# Patient Record
Sex: Female | Born: 1990
Health system: Southern US, Community
[De-identification: ages and names within clinical notes are randomized; demographics above are authoritative.]

## PROBLEM LIST (undated history)

## (undated) DIAGNOSIS — B009 Herpesviral infection, unspecified: Secondary | ICD-10-CM

## (undated) DIAGNOSIS — D649 Anemia, unspecified: Secondary | ICD-10-CM

## (undated) HISTORY — PX: OTHER SURGICAL HISTORY: SHX169

## (undated) HISTORY — DX: Anemia, unspecified: D64.9

## (undated) HISTORY — DX: Herpesviral infection, unspecified: B00.9

---
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
## 2013-04-23 ENCOUNTER — Ambulatory Visit
Admit: 2013-04-23 | Discharge: 2013-04-23 | Disposition: A | Discharge status: Home / Self Care | Payer: Self-pay | Source: Ambulatory Visit | Attending: Nurse Practitioner | Admitting: Nurse Practitioner

## 2013-04-24 ENCOUNTER — Ambulatory Visit
Admit: 2013-04-24 | Discharge: 2013-04-24 | Disposition: A | Discharge status: Home / Self Care | Payer: Self-pay | Source: Ambulatory Visit | Attending: Internal Medicine | Admitting: Internal Medicine

## 2013-04-24 LAB — CBC
Hematocrit: 40 % (ref 34–45)
Hemoglobin: 12.7 g/dL (ref 11.2–15.7)
MCH: 26 pg/cell (ref 26–32)
MCHC: 32 g/dL (ref 32–36)
MCV: 81 fL (ref 79–95)
Platelets: 406 10*3/uL — ABNORMAL HIGH (ref 160–370)
RBC: 4.9 MIL/uL (ref 3.9–5.2)
RDW: 15.7 % — ABNORMAL HIGH (ref 11.7–14.4)
WBC: 4.6 10*3/uL (ref 4.0–10.0)

## 2013-04-24 LAB — FERRITIN: Ferritin: 13 ng/mL (ref 10–120)

## 2013-04-25 LAB — GRAM STAIN- VAGINOSIS: Gram Stain-Vaginosis: 0

## 2013-05-22 ENCOUNTER — Ambulatory Visit
Admit: 2013-05-22 | Discharge: 2013-05-22 | Disposition: A | Discharge status: Home / Self Care | Payer: Self-pay | Source: Ambulatory Visit | Attending: Internal Medicine | Admitting: Internal Medicine

## 2013-05-23 LAB — VITAMIN B12: Vitamin B12: 757 pg/mL (ref 211–946)

## 2013-05-23 LAB — AEROBIC CULTURE: Aerobic Culture: 0

## 2014-04-21 ENCOUNTER — Encounter: Payer: Self-pay | Admitting: Emergency Medicine

## 2014-04-21 ENCOUNTER — Emergency Department
Admission: EM | Admit: 2014-04-21 | Disposition: A | Discharge status: Home / Self Care | Payer: Self-pay | Source: Ambulatory Visit | Attending: Emergency Medicine | Admitting: Emergency Medicine

## 2014-04-21 LAB — HM HIV SCREENING OFFERED

## 2014-04-21 LAB — CBC AND DIFFERENTIAL
Baso # K/uL: 0 10*3/uL (ref 0.0–0.1)
Basophil %: 0.8 %
Eos # K/uL: 0.1 10*3/uL (ref 0.0–0.4)
Eosinophil %: 1.5 %
Hematocrit: 35 % (ref 34–45)
Hemoglobin: 10.9 g/dL — ABNORMAL LOW (ref 11.2–15.7)
IMM Granulocytes #: 0 10*3/uL (ref 0.0–0.1)
IMM Granulocytes: 0.2 %
Lymph # K/uL: 2.3 10*3/uL (ref 1.2–3.7)
Lymphocyte %: 44.8 %
MCH: 24 pg/cell — ABNORMAL LOW (ref 26–32)
MCHC: 31 g/dL — ABNORMAL LOW (ref 32–36)
MCV: 78 fL — ABNORMAL LOW (ref 79–95)
Mono # K/uL: 0.4 10*3/uL (ref 0.2–0.9)
Monocyte %: 7.5 %
Neut # K/uL: 2.4 10*3/uL (ref 1.6–6.1)
Nucl RBC # K/uL: 0 10*3/uL (ref 0.0–0.0)
Nucl RBC %: 0 /100 WBC (ref 0.0–0.2)
Platelets: 507 10*3/uL — ABNORMAL HIGH (ref 160–370)
RBC: 4.5 MIL/uL (ref 3.9–5.2)
RDW: 16.2 % — ABNORMAL HIGH (ref 11.7–14.4)
Seg Neut %: 45.2 %
WBC: 5.2 10*3/uL (ref 4.0–10.0)

## 2014-04-21 LAB — BASIC METABOLIC PANEL
Anion Gap: 16 (ref 7–16)
CO2: 22 mmol/L (ref 20–28)
Calcium: 9.7 mg/dL (ref 9.0–10.4)
Chloride: 101 mmol/L (ref 96–108)
Creatinine: 0.78 mg/dL (ref 0.51–0.95)
GFR,Black: 125 *
GFR,Caucasian: 109 *
Glucose: 89 mg/dL (ref 60–99)
Lab: 12 mg/dL (ref 6–20)
Potassium: 4.7 mmol/L (ref 3.3–5.1)
Sodium: 139 mmol/L (ref 133–145)

## 2014-04-21 LAB — URINALYSIS WITH MICROSCOPIC
Ketones, UA: NEGATIVE
Nitrite,UA: NEGATIVE
Protein,UA: NEGATIVE mg/dL
RBC,UA: 1 /hpf (ref 0–2)
Specific Gravity,UA: 1.02 (ref 1.002–1.030)
WBC,UA: 3 /hpf (ref 0–5)
pH,UA: 5 (ref 5.0–8.0)

## 2014-04-21 LAB — HOLD SST

## 2014-04-21 LAB — BHCG, QUANT PREGNANCY: BHCG, QUANT PREGNANCY: <1 m[IU]/mL (ref 0–1)

## 2014-04-21 LAB — HOLD GREEN WITH GEL

## 2014-04-21 MED ORDER — SODIUM CHLORIDE 0.9 % IV BOLUS *I*
1000.0000 mL | Freq: Once | Status: AC
Start: 2014-04-21 — End: 2014-04-21
  Administered 2014-04-21: 1000 mL via INTRAVENOUS

## 2014-04-21 NOTE — ED Notes (Signed)
C/o left lower abdominal pain and constipation for the past several days. Last BM on this past Friday.

## 2014-04-21 NOTE — ED Notes (Signed)
Pt c/o left hip pain since Saturday, sharp pain. Non-radiating. Notes previously constipated x1 week and pain started after that. Denies numbness, burning, dizziness, n/v/d at this time. PIV placed.

## 2014-04-21 NOTE — ED Notes (Signed)
Patient ambulated to bathroom without issue, chart reviewed, treatment continues per medical orders.

## 2014-04-21 NOTE — ED Provider Notes (Addendum)
History     Chief Complaint   Patient presents with    Abdominal Pain    Constipation       HPI Comments: 24 y.o. female w/ pmh of prediabetes and undergoing workup of PCOS presents to the ED with hip pain and constipation. She notes what she thinks is related constipation the last week due to dietary changes but presented due to acute hip pain since last weekend.  No trauma or strain recalled. Did start working out recently. Pain is recreated with pressure over her hip and by walking.  Occasional dark urine noted without pain or gross hematuria.  No fevers, recent illness, N/V/D, weight change.  Abnormal menstrual cycles at baseline, last LMP Nov? or Dec?, on birth control, has never been sexually active.  No night sweats.       History provided by:  Patient      Past Medical History   Diagnosis Date    Prediabetes             History reviewed. No pertinent past surgical history.    History reviewed. No pertinent family history.      Social History      has no tobacco, alcohol, drug, and sexual activity history on file.    Living Situation     Questions Responses    Patient lives with     Homeless     Caregiver for other family member     External Services     Employment     Domestic Violence Risk           Problem List     There is no problem list on file for this patient.      Review of Systems   Review of Systems   Constitutional: Negative for fever and unexpected weight change.   HENT: Negative for ear pain.    Eyes: Negative for pain.   Respiratory: Negative for shortness of breath.    Cardiovascular: Negative for chest pain.   Gastrointestinal: Positive for constipation. Negative for nausea, vomiting and abdominal pain.   Genitourinary: Negative for dysuria and pelvic pain.   Musculoskeletal: Positive for arthralgias. Negative for back pain and neck pain.   Skin: Negative for wound.   Neurological: Negative for weakness, numbness and headaches.   Psychiatric/Behavioral: Negative for confusion.              Physical Exam       ED Triage Vitals   BP Heart Rate Heart Rate (via Pulse Ox) Resp Temp Temp src SpO2 O2 Device O2 Flow Rate   04/21/14 0838 04/21/14 1610 -- 04/21/14 9604 04/21/14 5409 04/21/14 0838 04/21/14 0838 04/21/14 0838 --   119/74 mmHg 72  20 36.6 C (97.9 F) TEMPORAL 99 % None (Room air)       Weight           --                          Physical Exam   Constitutional: She is oriented to person, place, and time. No distress.   HENT:   Head: Normocephalic and atraumatic.   Right Ear: External ear normal.   Left Ear: External ear normal.   Nose: Nose normal.   Mouth/Throat: Oropharynx is clear and moist.   Eyes: Conjunctivae are normal.   Neck: No tracheal deviation present.   Cardiovascular: Normal rate, regular rhythm and intact distal pulses.  Pulmonary/Chest: Effort normal. No respiratory distress. She has no wheezes. She has no rales. She exhibits no tenderness.   Abdominal: Soft. She exhibits no distension. There is no tenderness. There is no rebound and no guarding.   Musculoskeletal:        Left hip: She exhibits tenderness and bony tenderness. She exhibits normal range of motion and no deformity.        Legs:  Mild limp with walking.    Neurological: She is alert and oriented to person, place, and time.   Skin: Skin is warm and dry. She is not diaphoretic.   Psychiatric: She has a normal mood and affect.   Nursing note and vitals reviewed.      Medical Decision Making      Amount and/or Complexity of Data Reviewed  Clinical lab tests: ordered  Tests in the radiology section of CPT: ordered  Independent visualization of images, tracings, or specimens: yes        Initial Evaluation:  ED First Provider Contact     Date/Time Event User Comments    04/21/14 [phone] ED Provider First Contact Glennon MacWEBER, MARY ANN Initial Face to Face Provider Contact          Patient seen by me today 04/21/2014 at 0924    Assessment: 24 y.o. female w/ pmh of prediabetes and undergoing workup of PCOS presents to the ED  with hip pain and constipation. Vitals reassuring. Pt's pain is in musculoskeletal distribution low concern for intraabdominal or pelvic pathology. Vitals reassuring. Labs ordered in triage are reassuring.  We will obtain a screening x-ray.      Differential: Musculoskeletal pain, inguinal ligament strain, bony lesions, Anemia, Electrolyte abnormalities, pregnancy, UTI      Plan:   Labs:   CBC w/ diff  BMP  UA  Pregnancy    Imaging:  X-ray left hip    IVF: 0.9NS bolus    Dispo pending workup.     Joaquim LaiSathyaraj Murugappan, MD  Emergency Medicine PGY3        Joaquim LaiMurugappan, Sathyaraj, MD  04/21/14 1015  Resident Attestation:     Patient seen by me today, 04/21/2014 at 1005    History:   I reviewed this patient, reviewed the resident's note and agree.  Exam:   I examined this patient, reviewed the resident's note and agree.    Decision Making:   I discussed with the resident his/her documented decision making  and agree.      Author Joanna PuffJOEL L Acy Orsak, MD    Joanna PuffMay, Gena Laski L, MD  04/21/14 [phone]

## 2014-04-21 NOTE — First Provider Contact (Signed)
ED Medical Screening Exam Note    Initial provider evaluation performed by   ED First Provider Contact     Date/Time Event User Comments    04/21/14 [phone] ED Provider First Contact Megan Henderson ANN Initial Face to Face Provider Contact        Patient with " constipation" for a week, decreased urine output, Left lower abdominal/hip pain, no history of constipation  Vital signs reviewed.  Orders placed:  LABS, ANALGESIA and urine     Patient requires further evaluation.     Megan Henderson ANN SlickWEBER, NP, 04/21/2014, 8:42 AM    Supervising physician Dr Silva BandyBodkin  was immediately available

## 2014-04-21 NOTE — ED Notes (Addendum)
Assumed care of patient. C/o of left hip pain that may be due to her constipation. Pt denies n/v/d. Pt is in gown and resting comfortably in bed. Will continue to monitor and treat per MD orders.

## 2014-04-21 NOTE — ED Notes (Signed)
ED RN INTERN ATTESTATION       I Isaiah SergeJoseph Chiann Goffredo, RN (RN) reviewed the following charting information by the RN intern:CT    Nursing Assessments  Medications  Plan of Care  Teaching   Notes    In the chart of Megan HurlJulissa Henderson (23 y.o. female) and attest to the charting being accurate.

## 2014-04-21 NOTE — Discharge Instructions (Signed)
You were seen in the ED for left hip pain. An exam and workup was reassuring. Your symptoms improved with treatment in the ED. You likely have musculoskeletal pain as a cause of your symptoms. You may take ibuprofen 400mg  every 6 hours to treat your pain. You may take acetaminophen 1000mg  every 6 hours to treat your pain. Please follow up as above, or with your PCP for further outpatient workup of your symptoms.   Please return to the ED if you have severe persistent pain, persistent fevers >100.4 or any other concerning symptoms.  Please read the attached handout for your information.

## 2014-07-10 ENCOUNTER — Emergency Department
Admission: EM | Admit: 2014-07-10 | Disposition: A | Discharge status: Home / Self Care | Payer: Self-pay | Source: Ambulatory Visit | Attending: Emergency Medicine | Admitting: Emergency Medicine

## 2014-07-10 ENCOUNTER — Encounter: Payer: Self-pay | Admitting: Emergency Medicine

## 2014-07-10 NOTE — ED Notes (Signed)
[facility] currently alert but intoxicated, +n/v.

## 2014-07-10 NOTE — ED Provider Notes (Addendum)
History     Chief Complaint   Patient presents with    Alcohol Intoxication       HPI Comments: Megan Henderson is a 24 y.o. Female, previously healthy presenting with ETOH intoxication.  [place], notes 7 shots of liquor.  Notes emesis.  Denies any current pain, ingestion, or drug use.  Denies HA, neck pain, chest pain, SOB, abdominal pain.  Denies SI or HI.  Without complaints.  Tolerating PO.  Sober friend with patient and able to observe patient all night.      History provided by:  Patient      Past Medical History   Diagnosis Date    Prediabetes             History reviewed. No pertinent past surgical history.    No family history on file.      Social History      has no tobacco, alcohol, drug, and sexual activity history on file.    Living Situation     Questions Responses    Patient lives with     Homeless     Caregiver for other family member     External Services     Employment     Domestic Violence Risk           Problem List     There is no problem list on file for this patient.      Review of Systems   Review of Systems   Constitutional: Negative for fever.   Respiratory: Negative for shortness of breath.    Cardiovascular: Negative for chest pain.   Gastrointestinal: Positive for nausea and vomiting. Negative for abdominal pain and diarrhea.   Genitourinary: Negative for dysuria.   Musculoskeletal: Negative for gait problem.   Skin: Negative for rash.   Neurological: Negative for seizures and headaches.   Psychiatric/Behavioral:        ETOH intox.       Physical Exam     ED Triage Vitals   BP Heart Rate Heart Rate (via Pulse Ox) Resp Temp Temp src SpO2 O2 Device O2 Flow Rate   07/10/14 1935 07/10/14 1935 -- 07/10/14 1935 07/10/14 1935 07/10/14 1934 07/10/14 1935 07/10/14 1934 --   104/74 mmHg 67  12 36.5 C (97.7 F) TEMPORAL 100 % None (Room air)       Weight           07/10/14 1935           68.04 kg (150 lb)               Physical Exam   Constitutional: She is oriented  to person, place, and time. No distress.   HENT:   Head: Normocephalic and atraumatic.   Eyes: EOM are normal. Pupils are equal, round, and reactive to light.   Neck: Normal range of motion.   Cardiovascular: Normal rate.    Pulmonary/Chest: Effort normal. No respiratory distress.   Abdominal: Soft. There is no tenderness. There is no rebound and no guarding.   Musculoskeletal: Normal range of motion. She exhibits no tenderness.   Neurological: She is alert and oriented to person, place, and time.   Notable for mild intoxication.   Skin: Skin is warm and dry.   Psychiatric: She has a normal mood and affect.   Nursing note and vitals reviewed.      Medical Decision Making      Amount and/or Complexity of Data Reviewed  Review and summarize past medical records: yes        Initial Evaluation:  ED First Provider Contact     Date/Time Event User Comments    07/10/14 2108 ED Provider First Contact LIN, HOWARD Initial Face to Face Provider Contact          Patient seen by me as above    Assessment: Megan Henderson is a 24 y.o. Female, previously healthy presenting with ETOH intoxication.  The physical exam is significant for intoxication.  Based on the presenting symptoms, physical exam, and review of medical records, the differential diagnosis is concerning for but not limited to the following.    Differential Diagnosis: ETOH intoxication.  No clinical signs of trauma.  Low suspicion for ingestion, or drug use.    Plan    PO Trial.    Disposition: Home.      Billie LadeHoward Lin, MD              Billie LadeLin, Howard, MD  07/10/14 2136    Resident Attestation:     Patient seen by me on arrival date of 07/10/2014 at 2124    History:   I reviewed this patient, reviewed the resident's note and agree.  Exam:   I examined this patient, reviewed the resident's note and agree.    Decision Making:   I discussed with the resident his/her documented decision making  and agree.      Brief Summary:    24 year old female presents with alcohol  intoxication.  Low suspicion for trauma thus no imaging ordered.  Patient was monitored in the emergency department and she remained able to care for her own needs.  The patient was discharged with her friend to plan to stay with her and keep a close eye on her.    Odis LusterAuthor Charisse Wendell A, MD        Candise CheMazzillo, Trinda Harlacher A, MD  07/11/14 [phone]

## 2014-07-10 NOTE — ED Notes (Signed)
Pt presents to the ED after being found [place] intoxicated and supposedly unresponsive, currently responding to questions, rudely but responding.  States that she had 7 shots but [facility].  Pt laying with friend at bedside.

## 2014-07-10 NOTE — Discharge Instructions (Signed)
You were seen for alcohol intoxication.  Your work up reveals that you do not have an emergent or life threatening process at this time, though there is always the possibility that something may develop, thus it is very important that you follow up with your primary care doctor as we discussed.    Follow up with your primary care doctor as needed and please bring them a copy of these instructions so that they may know what happened here in the emergency department.    Return to your doctor's office or to the emergency department immediately for any persistent symptoms, persistent pain, fever, persistent nausea and vomiting, overall general feeling of being sick, or any other concerning symptoms.    [facility].

## 2014-07-10 NOTE — ED Provider Progress Notes (Signed)
ED Provider Progress Note      Last Filed Vitals    07/10/14 1935   BP: 104/74   Pulse: 67   Temp: 36.5 C (97.7 F)   Resp: 12   SpO2: 100%       The patient is without complaints.    The patient is ambulating, tolerating PO, and vital signs are stable. Discharged with strict return precautions.  Pt's friend agrees to monitoring patient overnight.       Billie LadeHoward Chasya Zenz, MD, 07/10/2014, 9:36 PM

## 2019-01-07 DIAGNOSIS — H52223 Regular astigmatism, bilateral: Secondary | ICD-10-CM | POA: Diagnosis not present

## 2019-05-12 ENCOUNTER — Ambulatory Visit: Payer: Self-pay

## 2019-05-20 DIAGNOSIS — N920 Excessive and frequent menstruation with regular cycle: Secondary | ICD-10-CM | POA: Diagnosis not present

## 2019-05-20 DIAGNOSIS — M549 Dorsalgia, unspecified: Secondary | ICD-10-CM | POA: Diagnosis not present

## 2019-05-29 DIAGNOSIS — B001 Herpesviral vesicular dermatitis: Secondary | ICD-10-CM | POA: Diagnosis not present

## 2019-05-29 DIAGNOSIS — M25531 Pain in right wrist: Secondary | ICD-10-CM | POA: Diagnosis not present

## 2019-05-29 DIAGNOSIS — N644 Mastodynia: Secondary | ICD-10-CM | POA: Diagnosis not present

## 2019-05-29 DIAGNOSIS — M25532 Pain in left wrist: Secondary | ICD-10-CM | POA: Diagnosis not present

## 2019-06-02 DIAGNOSIS — Z13220 Encounter for screening for lipoid disorders: Secondary | ICD-10-CM | POA: Diagnosis not present

## 2019-06-02 DIAGNOSIS — R8290 Unspecified abnormal findings in urine: Secondary | ICD-10-CM | POA: Diagnosis not present

## 2019-06-12 DIAGNOSIS — Z20822 Contact with and (suspected) exposure to covid-19: Secondary | ICD-10-CM | POA: Diagnosis not present

## 2019-06-19 ENCOUNTER — Encounter: Payer: Self-pay | Admitting: Certified Nurse Midwife

## 2019-06-19 ENCOUNTER — Other Ambulatory Visit: Payer: Self-pay

## 2019-06-19 ENCOUNTER — Ambulatory Visit (INDEPENDENT_AMBULATORY_CARE_PROVIDER_SITE_OTHER): Payer: BC Managed Care – PPO | Admitting: Certified Nurse Midwife

## 2019-06-19 VITALS — BP 118/81 | HR 75 | Ht 66.0 in | Wt 193.2 lb

## 2019-06-19 DIAGNOSIS — Z6831 Body mass index (BMI) 31.0-31.9, adult: Secondary | ICD-10-CM

## 2019-06-19 DIAGNOSIS — N921 Excessive and frequent menstruation with irregular cycle: Secondary | ICD-10-CM

## 2019-06-19 DIAGNOSIS — N644 Mastodynia: Secondary | ICD-10-CM | POA: Diagnosis not present

## 2019-06-19 DIAGNOSIS — E669 Obesity, unspecified: Secondary | ICD-10-CM

## 2019-06-19 DIAGNOSIS — Z8349 Family history of other endocrine, nutritional and metabolic diseases: Secondary | ICD-10-CM

## 2019-06-19 NOTE — Progress Notes (Signed)
GYN ENCOUNTER NOTE  Subjective:       Stacy Harding is a 29 y.o. G0P0000 female is here for gynecologic evaluation of the following issues:  1. Menorrhagia with irregular cycle 2. Breast pain  Menarche at age 60. Currently working on shifting diet, decreasing dairy and meat intake.   Notes occasional hip and gas pain.   Family history of thyroid disease. Previous provided suspected PCOS.   Denies difficulty breathing or respiratory distress, chest pain, abdominal pain, dysuria, and leg pain or swelling.   Gynecologic History  Patient's last menstrual period was 06/02/2019 (exact date). Period Pattern: (!) Irregular Menstrual Flow: Light, Moderate, Heavy Menstrual Control: Thin pad, Maxi pad Dysmenorrhea: None  Contraception: abstinence   Last Pap: 09/2018. Results were: abnormal, normal colposcopy; recommendation for repeat in one (1) year  Obstetric History  OB History  Gravida Para Term Preterm AB Living  0 0 0 0 0 0  SAB TAB Ectopic Multiple Live Births  0 0 0 0 0    Past Medical History:  Diagnosis Date  . Herpes simplex type 1 infection    No Known Allergies  Social History   Socioeconomic History  . Marital status: Single    Spouse name: Not on file  . Number of children: Not on file  . Years of education: Not on file  . Highest education level: Not on file  Occupational History  . Not on file  Tobacco Use  . Smoking status: Never Smoker  . Smokeless tobacco: Never Used  Substance and Sexual Activity  . Alcohol use: Yes    Comment: occas  . Drug use: Never  . Sexual activity: Not Currently    Birth control/protection: None  Other Topics Concern  . Not on file  Social History Narrative  . Not on file   Social Determinants of Health   Financial Resource Strain:   . Difficulty of Paying Living Expenses:   Food Insecurity:   . Worried About Programme researcher, broadcasting/film/video in the Last Year:   . Barista in the Last Year:   Transportation Needs:    . Freight forwarder (Medical):   Marland Kitchen Lack of Transportation (Non-Medical):   Physical Activity:   . Days of Exercise per Week:   . Minutes of Exercise per Session:   Stress:   . Feeling of Stress :   Social Connections:   . Frequency of Communication with Friends and Family:   . Frequency of Social Gatherings with Friends and Family:   . Attends Religious Services:   . Active Member of Clubs or Organizations:   . Attends Banker Meetings:   Marland Kitchen Marital Status:   Intimate Partner Violence:   . Fear of Current or Ex-Partner:   . Emotionally Abused:   Marland Kitchen Physically Abused:   . Sexually Abused:     Family History  Problem Relation Age of Onset  . Breast cancer Maternal Aunt   . Cancer Maternal Aunt   . Thyroid disease Maternal Aunt   . Stroke Maternal Uncle   . Diabetes Paternal Aunt     The following portions of the patient's history were reviewed and updated as appropriate: allergies, current medications, past family history, past medical history, past social history, past surgical history and problem list.  Review of Systems  ROS negative except as noted above. Information obtained from patient.   Objective:   BP 118/81   Pulse 75   Ht 5\' 6"  (1.676 m)  Wt 193 lb 3 oz (87.6 kg)   LMP 06/02/2019 (Exact Date)   BMI 31.18 kg/m   CONSTITUTIONAL: Well-developed, well-nourished female in no acute distress.   NECK: Normal range of motion, supple, no masses.  Normal thyroid.   Assessment:   1. BMI 31.0-31.9,adult  - TSH - T3, free - T4, free - FSH/LH  2. Menorrhagia with irregular cycle  - TSH - T3, free - T4, free - FSH/LH - Estradiol - Progesterone   3. Family history of thyroid disease  Plan:   Labs today, see orders. Will contact patient with results and to further discuss plan of care.   Discussed differential diagnosis and treatment options.   Reviewed red flag symptoms and when to call.   RTC x 4 months for ANNUAL EXAM or  sooner if needed.    Diona Fanti, CNM [facility]   A total of 20 minutes were spent face-to-face with the patient during this encounter and over half of that time involved counseling and coordination of care.

## 2019-06-19 NOTE — Patient Instructions (Signed)

## 2019-06-20 LAB — ESTRADIOL: Estradiol: 49.6 pg/mL

## 2019-06-20 LAB — T3, FREE: T3, Free: 2.8 pg/mL (ref 2.0–4.4)

## 2019-06-20 LAB — FSH/LH
FSH: 5.8 m[IU]/mL
LH: 14.6 m[IU]/mL

## 2019-06-20 LAB — T4, FREE: Free T4: 1.06 ng/dL (ref 0.82–1.77)

## 2019-06-20 LAB — PROGESTERONE: Progesterone: 0.1 ng/mL

## 2019-06-20 LAB — TSH: TSH: 0.977 u[IU]/mL (ref 0.450–4.500)

## 2019-06-23 ENCOUNTER — Encounter: Payer: Self-pay | Admitting: Certified Nurse Midwife

## 2019-08-07 ENCOUNTER — Telehealth: Payer: Self-pay | Admitting: Certified Nurse Midwife

## 2019-08-07 NOTE — Telephone Encounter (Signed)
Pt came in and stated she got a bill but the pt has family plan medicaid. Can we fix her coding to  a physical. The pt would like to know if it has been resolved.

## 2019-08-11 NOTE — Telephone Encounter (Signed)
LMTRC

## 2019-08-13 NOTE — Telephone Encounter (Signed)
Pt aware that the [facility] medicaid will not pay for problem visit not related to b/c.  Refilled visit with [employer]. Pt aware to give it 30 days to process. Thanks.

## 2019-10-20 ENCOUNTER — Encounter: Payer: BC Managed Care – PPO | Admitting: Certified Nurse Midwife

## 2019-11-13 DIAGNOSIS — Z0000 Encounter for general adult medical examination without abnormal findings: Secondary | ICD-10-CM | POA: Diagnosis not present

## 2019-11-14 DIAGNOSIS — Z0000 Encounter for general adult medical examination without abnormal findings: Secondary | ICD-10-CM | POA: Diagnosis not present

## 2019-11-14 DIAGNOSIS — Z13220 Encounter for screening for lipoid disorders: Secondary | ICD-10-CM | POA: Diagnosis not present

## 2019-12-12 ENCOUNTER — Encounter: Payer: Self-pay | Admitting: Certified Nurse Midwife

## 2019-12-12 ENCOUNTER — Ambulatory Visit (INDEPENDENT_AMBULATORY_CARE_PROVIDER_SITE_OTHER): Payer: BC Managed Care – PPO | Admitting: Certified Nurse Midwife

## 2019-12-12 ENCOUNTER — Other Ambulatory Visit (HOSPITAL_COMMUNITY)
Admission: RE | Admit: 2019-12-12 | Discharge: 2019-12-12 | Disposition: A | Discharge status: Home / Self Care | Payer: Self-pay | Source: Ambulatory Visit | Attending: Certified Nurse Midwife | Admitting: Certified Nurse Midwife

## 2019-12-12 ENCOUNTER — Other Ambulatory Visit: Payer: Self-pay

## 2019-12-12 VITALS — BP 130/88 | HR 68 | Ht 66.0 in | Wt 193.3 lb

## 2019-12-12 DIAGNOSIS — Z6831 Body mass index (BMI) 31.0-31.9, adult: Secondary | ICD-10-CM

## 2019-12-12 DIAGNOSIS — Z124 Encounter for screening for malignant neoplasm of cervix: Secondary | ICD-10-CM

## 2019-12-12 DIAGNOSIS — Z8742 Personal history of other diseases of the female genital tract: Secondary | ICD-10-CM | POA: Diagnosis not present

## 2019-12-12 DIAGNOSIS — N921 Excessive and frequent menstruation with irregular cycle: Secondary | ICD-10-CM

## 2019-12-12 DIAGNOSIS — Z01419 Encounter for gynecological examination (general) (routine) without abnormal findings: Secondary | ICD-10-CM | POA: Insufficient documentation

## 2019-12-12 NOTE — Patient Instructions (Signed)
Pap Test Why am I having this test? A Pap test, also called a Pap smear, is a screening test to check for signs of:  Cancer of the vagina, cervix, and uterus. The cervix is the lower part of the uterus that opens into the vagina.  Infection.  Changes that may be a sign that cancer is developing (precancerous changes). Women need this test on a regular basis. In general, you should have a Pap test every 3 years until you reach menopause or age 27. Women aged 30-60 may choose to have their Pap test done at the same time as an HPV (human papillomavirus) test every 5 years (instead of every 3 years). Your health care provider may recommend having Pap tests more or less often depending on your medical conditions and past Pap test results. What kind of sample is taken?  Your health care provider will collect a sample of cells from the surface of your cervix. This will be done using a small cotton swab, plastic spatula, or brush. This sample is often collected during a pelvic exam, when you are lying on your back on an exam table with feet in footrests (stirrups). In some cases, fluids (secretions) from the cervix or vagina may also be collected. How do I prepare for this test?  Be aware of where you are in your menstrual cycle. If you are menstruating on the day of the test, you may be asked to reschedule.  You may need to reschedule if you have a known vaginal infection on the day of the test.  Follow instructions from your health care provider about: ? Changing or stopping your regular medicines. Some medicines can cause abnormal test results, such as digitalis and tetracycline. ? Avoiding douching or taking a bath the day before or the day of the test. Tell a health care provider about:  Any allergies you have.  All medicines you are taking, including vitamins, herbs, eye drops, creams, and over-the-counter medicines.  Any blood disorders you have.  Any surgeries you have had.  Any  medical conditions you have.  Whether you are pregnant or may be pregnant. How are the results reported? Your test results will be reported as either abnormal or normal. A false-positive result can occur. A false positive is incorrect because it means that a condition is present when it is not. A false-negative result can occur. A false negative is incorrect because it means that a condition is not present when it is. What do the results mean? A normal test result means that you do not have signs of cancer of the vagina, cervix, or uterus. An abnormal result may mean that you have:  Cancer. A Pap test by itself is not enough to diagnose cancer. You will have more tests done in this case.  Precancerous changes in your vagina, cervix, or uterus.  Inflammation of the cervix.  An STD (sexually transmitted disease).  A fungal infection.  A parasite infection. Talk with your health care provider about what your results mean. Questions to ask your health care provider Ask your health care provider, or the department that is doing the test:  When will my results be ready?  How will I get my results?  What are my treatment options?  What other tests do I need?  What are my next steps? Summary  In general, women should have a Pap test every 3 years until they reach menopause or age 27.  Your health care provider will collect a   sample of cells from the surface of your cervix. This will be done using a small cotton swab, plastic spatula, or brush.  In some cases, fluids (secretions) from the cervix or vagina may also be collected. This information is not intended to replace advice given to you by your health care provider. Make sure you discuss any questions you have with your health care provider. Document Revised: 11/20/2016 Document Reviewed: 11/20/2016 Elsevier Patient Education  2020 [place] 63-55 Years Old, Female Preventive care refers to visits  with your health care provider and lifestyle choices that can promote health and wellness. This includes:  A yearly physical exam. This may also be called an annual well check.  Regular dental visits and eye exams.  Immunizations.  Screening for certain conditions.  Healthy lifestyle choices, such as eating a healthy diet, getting regular exercise, not using drugs or products that contain nicotine and tobacco, and limiting alcohol use. What can I expect for my preventive care visit? Physical exam Your health care provider will check your:  Height and weight. This may be used to calculate body mass index (BMI), which tells if you are at a healthy weight.  Heart rate and blood pressure.  Skin for abnormal spots. Counseling Your health care provider may ask you questions about your:  Alcohol, tobacco, and drug use.  Emotional well-being.  Home and relationship well-being.  Sexual activity.  Eating habits.  Work and work Statistician.  Method of birth control.  Menstrual cycle.  Pregnancy history. What immunizations do I need?  Influenza (flu) vaccine  This is recommended every year. Tetanus, diphtheria, and pertussis (Tdap) vaccine  You may need a Td booster every 10 years. Varicella (chickenpox) vaccine  You may need this if you have not been vaccinated. Human papillomavirus (HPV) vaccine  If recommended by your health care provider, you may need three doses over 6 months. Measles, mumps, and rubella (MMR) vaccine  You may need at least one dose of MMR. You may also need a second dose. Meningococcal conjugate (MenACWY) vaccine  One dose is recommended if you are age 88-21 years and a first-year college student living in a residence hall, or if you have one of several medical conditions. You may also need additional booster doses. Pneumococcal conjugate (PCV13) vaccine  You may need this if you have certain conditions and were not previously  vaccinated. Pneumococcal polysaccharide (PPSV23) vaccine  You may need one or two doses if you smoke cigarettes or if you have certain conditions. Hepatitis A vaccine  You may need this if you have certain conditions or if you travel or work in places where you may be exposed to hepatitis A. Hepatitis B vaccine  You may need this if you have certain conditions or if you travel or work in places where you may be exposed to hepatitis B. Haemophilus influenzae type b (Hib) vaccine  You may need this if you have certain conditions. You may receive vaccines as individual doses or as more than one vaccine together in one shot (combination vaccines). Talk with your health care provider about the risks and benefits of combination vaccines. What tests do I need?  Blood tests  Lipid and cholesterol levels. These may be checked every 5 years starting at age 43.  Hepatitis C test.  Hepatitis B test. Screening  Diabetes screening. This is done by checking your blood sugar (glucose) after you have not eaten for a while (fasting).  Sexually transmitted disease (  STD) testing.  BRCA-related cancer screening. This may be done if you have a family history of breast, ovarian, tubal, or peritoneal cancers.  Pelvic exam and Pap test. This may be done every 3 years starting at age 74. Starting at age 82, this may be done every 5 years if you have a Pap test in combination with an HPV test. Talk with your health care provider about your test results, treatment options, and if necessary, the need for more tests. Follow these instructions at home: Eating and drinking   Eat a diet that includes fresh fruits and vegetables, whole grains, lean protein, and low-fat dairy.  Take vitamin and mineral supplements as recommended by your health care provider.  Do not drink alcohol if: ? Your health care provider tells you not to drink. ? You are pregnant, may be pregnant, or are planning to become  pregnant.  If you drink alcohol: ? Limit how much you have to 0-1 drink a day. ? Be aware of how much alcohol is in your drink. In the [place] one drink equals one 12 oz bottle of beer (355 mL), one 5 oz glass of wine (148 mL), or one 1 oz glass of hard liquor (44 mL). Lifestyle  Take daily care of your teeth and gums.  Stay active. Exercise for at least 30 minutes on 5 or more days each week.  Do not use any products that contain nicotine or tobacco, such as cigarettes, e-cigarettes, and chewing tobacco. If you need help quitting, ask your health care provider.  If you are sexually active, practice safe sex. Use a condom or other form of birth control (contraception) in order to prevent pregnancy and STIs (sexually transmitted infections). If you plan to become pregnant, see your health care provider for a preconception visit. What's next?  Visit your health care provider once a year for a well check visit.  Ask your health care provider how often you should have your eyes and teeth checked.  Stay up to date on all vaccines. This information is not intended to replace advice given to you by your health care provider. Make sure you discuss any questions you have with your health care provider. Document Revised: 11/22/2017 Document Reviewed: 11/22/2017 Elsevier Patient Education  [place].   Menorrhagia Menorrhagia is when your menstrual periods are heavy or last longer than normal. Follow these instructions at home: Medicines   Take over-the-counter and prescription medicines exactly as told by your doctor. This includes iron pills.  Do not change or switch medicines without asking your doctor.  Do not take aspirin or medicines that contain aspirin 1 week before or during your period. Aspirin may make bleeding worse. General instructions  If you need to change your pad or tampon more than once every 2 hours, limit your activity until the bleeding stops.  Iron pills  can cause problems when pooping (constipation). To prevent or treat pooping problems while taking prescription iron pills, your doctor may suggest that you: ? Drink enough fluid to keep your pee (urine) clear or pale yellow. ? Take over-the-counter or prescription medicines. ? Eat foods that are high in fiber. These foods include:  Fresh fruits and vegetables.  Whole grains.  Beans. ? Limit foods that are high in fat and processed sugars. This includes fried and sweet foods.  Eat healthy meals and foods that are high in iron. Foods that have a lot of iron include: ? Leafy green vegetables. ? Meat. ? Liver. ?  Eggs. ? Whole grain breads and cereals.  Do not try to lose weight until your heavy bleeding has stopped and you have normal amounts of iron in your blood. If you need to lose weight, work with your doctor.  Keep all follow-up visits as told by your doctor. This is important. Contact a doctor if:  You soak through a pad or tampon every 1 or 2 hours, and this happens every time you have a period.  You need to use pads and tampons at the same time because you are bleeding so much.  You are taking medicine and you: ? Feel sick to your stomach (nauseous). ? Throw up (vomit). ? Have watery poop (diarrhea).  You have other problems that may be related to the medicine you are taking. Get help right away if:  You soak through more than a pad or tampon in 1 hour.  You pass clots bigger than 1 inch (2.5 cm) wide.  You feel short of breath.  You feel like your heart is beating too fast.  You feel dizzy or you pass out (faint).  You feel very weak or tired. Summary  Menorrhagia is when your menstrual periods are heavy or last longer than normal.  Take over-the-counter and prescription medicines exactly as told by your doctor. This includes iron pills.  Contact a doctor if you soak through more than a pad or tampon in 1 hour or are passing large clots. This information is  not intended to replace advice given to you by your health care provider. Make sure you discuss any questions you have with your health care provider. Document Revised: 06/20/2017 Document Reviewed: 04/03/2016 Elsevier Patient Education  [place].

## 2019-12-12 NOTE — Progress Notes (Signed)
ANNUAL PREVENTATIVE CARE GYN  ENCOUNTER NOTE  Subjective:       Stacy Harding is a 29 y.o. G0P0000 female here for a routine annual gynecologic exam.  Current complaints: 1. Menorrhagia irregular cycle 2. Breast pain 3. History of abnormal pap smear 4. Right hip lump the size of a "bb"  Denies difficulty breathing or respiratory distress, chest pain, abdominal pain, excessive vaginal bleeding, dysuria, and leg pain or swelling.    Gynecologic History  Patient's last menstrual period was 11/11/2019 (exact date). Period Cycle (Days):  (random cycle, may skip some months) Period Duration (Days): 3 weeks Period Pattern: (!) Irregular Menstrual Flow: Moderate, Heavy Menstrual Control Change Freq (Hours): 3 Dysmenorrhea: None  Contraception: abstinence   Last Pap: 2020. Results were: abnormal per patient  Obstetric History  OB History  Gravida Para Term Preterm AB Living  0 0 0 0 0 0  SAB TAB Ectopic Multiple Live Births  0 0 0 0 0    Past Medical History:  Diagnosis Date  . Anemia   . Herpes simplex type 1 infection     Past Surgical History:  Procedure Laterality Date  . umilical hernia      Current Outpatient Medications on File Prior to Visit  Medication Sig Dispense Refill  . Ascorbic Acid (VITAMIN C) 100 MG tablet Take 100 mg by mouth daily.    . Beta Carotene (VITAMIN A) 25000 UNIT capsule Take 25,000 Units by mouth daily.    . cholecalciferol (VITAMIN D3) 25 MCG (1000 UNIT) tablet Take 1,000 Units by mouth daily.    . ferrous sulfate 324 MG TBEC Take 324 mg by mouth.    . vitamin B-12 (CYANOCOBALAMIN) 100 MCG tablet Take 100 mcg by mouth daily.     No current facility-administered medications on file prior to visit.    No Known Allergies  Social History   Socioeconomic History  . Marital status: Single    Spouse name: Not on file  . Number of children: Not on file  . Years of education: Not on file  . Highest education level: Not on file   Occupational History  . Not on file  Tobacco Use  . Smoking status: Never Smoker  . Smokeless tobacco: Never Used  Vaping Use  . Vaping Use: Never used  Substance and Sexual Activity  . Alcohol use: Not Currently  . Drug use: Never  . Sexual activity: Not Currently    Birth control/protection: None  Other Topics Concern  . Not on file  Social History Narrative  . Not on file   Social Determinants of Health   Financial Resource Strain:   . Difficulty of Paying Living Expenses: Not on file  Food Insecurity:   . Worried About Programme researcher, broadcasting/film/video in the Last Year: Not on file  . Ran Out of Food in the Last Year: Not on file  Transportation Needs:   . Lack of Transportation (Medical): Not on file  . Lack of Transportation (Non-Medical): Not on file  Physical Activity:   . Days of Exercise per Week: Not on file  . Minutes of Exercise per Session: Not on file  Stress:   . Feeling of Stress : Not on file  Social Connections:   . Frequency of Communication with Friends and Family: Not on file  . Frequency of Social Gatherings with Friends and Family: Not on file  . Attends Religious Services: Not on file  . Active Member of Clubs or Organizations: Not on  file  . Attends Banker Meetings: Not on file  . Marital Status: Not on file  Intimate Partner Violence:   . Fear of Current or Ex-Partner: Not on file  . Emotionally Abused: Not on file  . Physically Abused: Not on file  . Sexually Abused: Not on file    Family History  Problem Relation Age of Onset  . Breast cancer Maternal Aunt   . Cancer Maternal Aunt   . Thyroid disease Maternal Aunt   . Stroke Maternal Uncle   . Diabetes Paternal Aunt     The following portions of the patient's history were reviewed and updated as appropriate: allergies, current medications, past family history, past medical history, past social history, past surgical history and problem list.  Review of Systems  ROS negative  except as noted above. Information obtained from patient.    Objective:   BP 130/88   Pulse 68   Ht 5\' 6"  (1.676 m)   Wt 193 lb 4.8 oz (87.7 kg)   LMP 11/11/2019 (Exact Date)   BMI 31.20 kg/m    CONSTITUTIONAL: Well-developed, well-nourished female in no acute distress.   PSYCHIATRIC: Normal mood and affect. Normal behavior. Normal judgment and thought content.  NEUROLGIC: Alert and oriented to person, place, and time. Normal muscle tone coordination. No cranial nerve deficit noted.  HENT:  Normocephalic, atraumatic, External right and left ear normal. Oropharynx is clear and moist  EYES: Conjunctivae and EOM are normal. Pupils are equal, round, and reactive to light. No scleral icterus.   NECK: Normal range of motion, supple, no masses.  Normal thyroid.   SKIN: Skin is warm and dry. No rash noted. Not diaphoretic. No  erythema. No pallor.  CARDIOVASCULAR: Normal heart rate noted, regular rhythm, no murmur.  RESPIRATORY: Clear to auscultation bilaterally. Effort and breath sounds normal, no problems with respiration noted.  BREASTS: Symmetric in size. No masses, skin changes, nipple drainage, or lymphadenopathy.  ABDOMEN: Soft, normal bowel sounds, no distention noted.  No tenderness, rebound or guarding.    PELVIC:  External Genitalia: Normal  Vagina: Normal  Cervix: Normal, Pap collected  Uterus: Normal  Adnexa: Normal  MUSCULOSKELETAL: Normal range of motion. No tenderness except for left hip.  No cyanosis, clubbing, or edema.  2+ distal pulses.   LYMPHATIC: No Axillary, Supraclavicular, or Inguinal Adenopathy.  Assessment:   Annual gynecologic examination 29 y.o.   Contraception: abstinence   Obesity 1   Problem List Items Addressed This Visit    None    Visit Diagnoses    Well woman exam    -  Primary   Relevant Orders   Cytology - PAP   Menorrhagia with irregular cycle       History of abnormal cervical Pap smear       Relevant Orders   Cytology -  PAP   Screening for cervical cancer       Relevant Orders   Cytology - PAP      Plan:   Pap: Pap, Reflex if ASCUS  Labs: See orders  Routine preventative health maintenance measures emphasized: Exercise/Diet/Weight control, Tobacco Warnings, Alcohol/Substance use risks, Stress Management, Peer Pressure Issues and Safe Sex; see AVS  Discussed home treatment measures for management of symptoms  Encouraged pelvic floor physical therapy  Return to Clinic - 1 Year for [place] EXAM or sooner if needed   [place], CNM  [facility], [facility] 12/12/19 8:51 AM

## 2019-12-13 LAB — ESTRADIOL: Estradiol: 328 pg/mL

## 2019-12-13 LAB — CBC
Hematocrit: 32.4 % — ABNORMAL LOW (ref 34.0–46.6)
Hemoglobin: 9.7 g/dL — ABNORMAL LOW (ref 11.1–15.9)
MCH: 23 pg — ABNORMAL LOW (ref 26.6–33.0)
MCHC: 29.9 g/dL — ABNORMAL LOW (ref 31.5–35.7)
MCV: 77 fL — ABNORMAL LOW (ref 79–97)
Platelets: 479 10*3/uL — ABNORMAL HIGH (ref 150–450)
RBC: 4.22 x10E6/uL (ref 3.77–5.28)
RDW: 19.5 % — ABNORMAL HIGH (ref 11.7–15.4)
WBC: 5.3 10*3/uL (ref 3.4–10.8)

## 2019-12-13 LAB — HEMOGLOBIN A1C
Est. average glucose Bld gHb Est-mCnc: 111 mg/dL
Hgb A1c MFr Bld: 5.5 % (ref 4.8–5.6)

## 2019-12-13 LAB — PROLACTIN: Prolactin: 32.3 ng/mL — ABNORMAL HIGH (ref 4.8–23.3)

## 2019-12-13 LAB — FSH/LH
FSH: 8.6 m[IU]/mL
LH: 49.2 m[IU]/mL

## 2019-12-13 LAB — PROGESTERONE: Progesterone: 0.6 ng/mL

## 2019-12-13 LAB — TSH: TSH: 1.1 u[IU]/mL (ref 0.450–4.500)

## 2019-12-18 LAB — CYTOLOGY - PAP
Comment: NEGATIVE
Diagnosis: UNDETERMINED — AB
High risk HPV: NEGATIVE

## 2020-01-27 ENCOUNTER — Emergency Department (HOSPITAL_BASED_OUTPATIENT_CLINIC_OR_DEPARTMENT_OTHER): Payer: BC Managed Care – PPO

## 2020-01-27 ENCOUNTER — Emergency Department (HOSPITAL_BASED_OUTPATIENT_CLINIC_OR_DEPARTMENT_OTHER)
Admission: EM | Admit: 2020-01-27 | Discharge: 2020-01-27 | Disposition: A | Discharge status: Home / Self Care | Payer: BC Managed Care – PPO | Attending: Emergency Medicine | Admitting: Emergency Medicine

## 2020-01-27 ENCOUNTER — Telehealth: Payer: Self-pay

## 2020-01-27 ENCOUNTER — Encounter (HOSPITAL_BASED_OUTPATIENT_CLINIC_OR_DEPARTMENT_OTHER): Payer: Self-pay | Admitting: *Deleted

## 2020-01-27 ENCOUNTER — Other Ambulatory Visit: Payer: Self-pay

## 2020-01-27 DIAGNOSIS — R1031 Right lower quadrant pain: Secondary | ICD-10-CM | POA: Insufficient documentation

## 2020-01-27 LAB — BASIC METABOLIC PANEL
Anion gap: 9 (ref 5–15)
BUN: 12 mg/dL (ref 6–20)
CO2: 25 mmol/L (ref 22–32)
Calcium: 9.3 mg/dL (ref 8.9–10.3)
Chloride: 101 mmol/L (ref 98–111)
Creatinine, Ser: 0.83 mg/dL (ref 0.44–1.00)
GFR, Estimated: >60 mL/min (ref >60)
Glucose, Bld: 105 mg/dL — ABNORMAL HIGH (ref 70–99)
Potassium: 3.8 mmol/L (ref 3.5–5.1)
Sodium: 135 mmol/L (ref 135–145)

## 2020-01-27 LAB — HEPATIC FUNCTION PANEL
ALT: 12 U/L (ref 0–44)
AST: 15 U/L (ref 15–41)
Albumin: 4.3 g/dL (ref 3.5–5.0)
Alkaline Phosphatase: 45 U/L (ref 38–126)
Bilirubin, Direct: 0.1 mg/dL (ref 0.0–0.2)
Indirect Bilirubin: 0.3 mg/dL (ref 0.3–0.9)
Total Bilirubin: 0.4 mg/dL (ref 0.3–1.2)
Total Protein: 7.6 g/dL (ref 6.5–8.1)

## 2020-01-27 LAB — CBC WITH DIFFERENTIAL/PLATELET
Abs Immature Granulocytes: 0.01 10*3/uL (ref 0.00–0.07)
Basophils Absolute: 0.1 10*3/uL (ref 0.0–0.1)
Basophils Relative: 1 %
Eosinophils Absolute: 0.1 10*3/uL (ref 0.0–0.5)
Eosinophils Relative: 1 %
HCT: 36.3 % (ref 36.0–46.0)
Hemoglobin: 11.2 g/dL — ABNORMAL LOW (ref 12.0–15.0)
Immature Granulocytes: 0 %
Lymphocytes Relative: 36 %
Lymphs Abs: 2.2 10*3/uL (ref 0.7–4.0)
MCH: 23.5 pg — ABNORMAL LOW (ref 26.0–34.0)
MCHC: 30.9 g/dL (ref 30.0–36.0)
MCV: 76.3 fL — ABNORMAL LOW (ref 80.0–100.0)
Monocytes Absolute: 0.4 10*3/uL (ref 0.1–1.0)
Monocytes Relative: 6 %
Neutro Abs: 3.4 10*3/uL (ref 1.7–7.7)
Neutrophils Relative %: 56 %
Platelets: 415 10*3/uL — ABNORMAL HIGH (ref 150–400)
RBC: 4.76 MIL/uL (ref 3.87–5.11)
RDW: 18.6 % — ABNORMAL HIGH (ref 11.5–15.5)
WBC: 6 10*3/uL (ref 4.0–10.5)
nRBC: 0 % (ref 0.0–0.2)

## 2020-01-27 LAB — WET PREP, GENITAL
Sperm: NONE SEEN
Trich, Wet Prep: NONE SEEN
WBC, Wet Prep HPF POC: NONE SEEN
Yeast Wet Prep HPF POC: NONE SEEN

## 2020-01-27 LAB — LIPASE, BLOOD: Lipase: 30 U/L (ref 11–51)

## 2020-01-27 MED ORDER — IOHEXOL 300 MG/ML  SOLN
100.0000 mL | Freq: Once | INTRAMUSCULAR | Status: AC | PRN
  Administered 2020-01-27: 100 mL via INTRAVENOUS

## 2020-01-27 MED ORDER — LACTATED RINGERS IV BOLUS
1000.0000 mL | Freq: Once | INTRAVENOUS | Status: AC
  Administered 2020-01-27: 1000 mL via INTRAVENOUS

## 2020-01-27 MED ORDER — METRONIDAZOLE 500 MG PO TABS: 500.0000 mg | ORAL_TABLET | Freq: Two times a day (BID) | ORAL | 0 refills | Status: DC

## 2020-01-27 NOTE — ED Triage Notes (Signed)
Abdominal pain for 12 hours. The pain is located in her right mid abdomen. She has a hard time moving due to pain. She was seen at [facility] and had a negative urine test. She was told to come to the ED for further testing.

## 2020-01-27 NOTE — ED Notes (Signed)
Pt refused collection of urine or HCG, HAMMOND informed.

## 2020-01-27 NOTE — Telephone Encounter (Signed)
Pt states she is having pain in her right side, deep in her back. States it is a  7/10 pain while sitting. Closer to an 8/10 while getting up or moving around. Pt states it worsened while she slept. Pt states it is a pain she has felt before but not as intense.  Pt states she is not sure if it is a cyst or appendicitis. Pt states she is outside of the ED. States she will go in the ED to get "checked out" incase it is her appendix. Informed her to keep [place] updated via mychart. Pt verbalized understanding

## 2020-01-27 NOTE — ED Provider Notes (Signed)
[facility] EMERGENCY DEPARTMENT Provider Note   CSN: [id number] Arrival date & time: 01/27/20  1134     History Chief Complaint  Patient presents with  . Abdominal Pain    Stacy Harding is a 29 y.o. female with a past medical history of anemia who presents today for evaluation of pain in her right sided lower abdomen.  She reports that the pain has been ongoing for about 16 hours, she will went to urgent care and was sent here.  She has not had any prior abdominal surgeries.  She reports that the pain is made worse with movement and touch, made better with being still.  She denies any nausea vomiting diarrhea.  She denies any abnormal vaginal discharge.  She does not have a history of ovarian cyst, states that she is not concerned about sexually transmitted infection.     HPI     Past Medical History:  Diagnosis Date  . Anemia   . Herpes simplex type 1 infection     There are no problems to display for this patient.   Past Surgical History:  Procedure Laterality Date  . umilical hernia       OB History    Gravida  0   Para  0   Term  0   Preterm  0   AB  0   Living  0     SAB  0   TAB  0   Ectopic  0   Multiple  0   Live Births  0           Family History  Problem Relation Age of Onset  . Breast cancer Maternal Aunt   . Cancer Maternal Aunt   . Thyroid disease Maternal Aunt   . Stroke Maternal Uncle   . Diabetes Paternal Aunt     Social History   Tobacco Use  . Smoking status: Never Smoker  . Smokeless tobacco: Never Used  Vaping Use  . Vaping Use: Never used  Substance Use Topics  . Alcohol use: Not Currently  . Drug use: Never    Home Medications Prior to Admission medications   Medication Sig Start Date End Date Taking? Authorizing Provider  Ascorbic Acid (VITAMIN C) 100 MG tablet Take 100 mg by mouth daily.   Yes [provider]  Beta Carotene (VITAMIN A) 25000 UNIT capsule Take 25,000 Units by mouth  daily.   Yes [provider]  cholecalciferol (VITAMIN D3) 25 MCG (1000 UNIT) tablet Take 1,000 Units by mouth daily.   Yes [provider]  ferrous sulfate 324 MG TBEC Take 324 mg by mouth.   Yes [provider]  vitamin B-12 (CYANOCOBALAMIN) 100 MCG tablet Take 100 mcg by mouth daily.   Yes [provider]  metroNIDAZOLE (FLAGYL) 500 MG tablet Take 1 tablet (500 mg total) by mouth 2 (two) times daily. 01/27/20   Cristina Gong, PA-C    Allergies    Patient has no known allergies.  Review of Systems   Review of Systems  Constitutional: Negative for chills and fever.  HENT: Negative for congestion and drooling.   Respiratory: Negative for chest tightness.   Cardiovascular: Negative for chest pain and palpitations.  Gastrointestinal: Positive for abdominal pain. Negative for diarrhea, nausea and vomiting.  Genitourinary: Negative for dysuria, pelvic pain and urgency.  Musculoskeletal: Negative for back pain.  Skin: Negative for color change and wound.  Neurological: Negative for weakness and headaches.  All other  systems reviewed and are negative.   Physical Exam Updated Vital Signs BP 117/78   Pulse 60   Temp 98.4 F (36.9 C) (Oral)   Resp 18   Ht 5' 5.5" (1.664 m)   Wt 87.5 kg   LMP 01/26/2020   SpO2 100%   BMI 31.60 kg/m   Physical Exam Vitals and nursing note reviewed.  Constitutional:      General: She is not in acute distress.    Appearance: She is well-developed. She is not diaphoretic.  HENT:     Head: Normocephalic and atraumatic.  Eyes:     General: No scleral icterus.       Right eye: No discharge.        Left eye: No discharge.     Conjunctiva/sclera: Conjunctivae normal.  Cardiovascular:     Rate and Rhythm: Normal rate and regular rhythm.  Pulmonary:     Effort: Pulmonary effort is normal. No respiratory distress.     Breath sounds: No stridor.  Abdominal:     General: Bowel sounds are normal. There is no  distension.     Palpations: Abdomen is soft.     Tenderness: There is abdominal tenderness in the right lower quadrant and suprapubic area. There is no right CVA tenderness, left CVA tenderness, guarding or rebound.  Musculoskeletal:        General: No deformity.     Cervical back: Normal range of motion.  Skin:    General: Skin is warm and dry.  Neurological:     Mental Status: She is alert.     Motor: No abnormal muscle tone.     Comments: Patient is awake and alert, grossly neurologically intact.  Speech is slurred.  Answers questions appropriately.  Psychiatric:        Behavior: Behavior normal.     ED Results / Procedures / Treatments   Labs (all labs ordered are listed, but only abnormal results are displayed) Labs Reviewed  WET PREP, GENITAL - Abnormal; Notable for the following components:      Result Value   Clue Cells Wet Prep HPF POC PRESENT (*)    All other components within normal limits  CBC WITH DIFFERENTIAL/PLATELET - Abnormal; Notable for the following components:   Hemoglobin 11.2 (*)    MCV 76.3 (*)    MCH 23.5 (*)    RDW 18.6 (*)    Platelets 415 (*)    All other components within normal limits  BASIC METABOLIC PANEL - Abnormal; Notable for the following components:   Glucose, Bld 105 (*)    All other components within normal limits  LIPASE, BLOOD  HEPATIC FUNCTION PANEL  URINALYSIS, ROUTINE W REFLEX MICROSCOPIC  PREGNANCY, URINE  GC/CHLAMYDIA PROBE AMP ([place]) NOT AT [facility]    EKG None  Radiology CT Abdomen Pelvis W Contrast  Result Date: 01/27/2020 CLINICAL DATA:  Right lower quadrant pain, appendicitis suspected EXAM: CT ABDOMEN AND PELVIS WITH CONTRAST TECHNIQUE: Multidetector CT imaging of the abdomen and pelvis was performed using the standard protocol following bolus administration of intravenous contrast. CONTRAST:  OMNIPAQUE IOHEXOL 300 MG/ML  SOLN COMPARISON:  None FINDINGS: Lower chest: No acute abnormality. Hepatobiliary: No  focal liver abnormality. No gallstones, gallbladder wall thickening, or pericholecystic fluid. No biliary dilatation. Pancreas: No focal lesion. Normal pancreatic contour. No surrounding inflammatory changes. No main pancreatic ductal dilatation. Spleen: Normal in size without focal abnormality. Adrenals/Urinary Tract: No adrenal nodule bilaterally. Bilateral kidneys enhance symmetrically. No hydronephrosis. No hydroureter.  The urinary bladder is unremarkable. Stomach/Bowel: Stomach is within normal limits. No evidence of bowel wall thickening or dilatation. Appendix appears normal. Vascular/Lymphatic: No significant vascular findings are present. No enlarged abdominal or pelvic lymph nodes. Reproductive: Uterus, bilateral ovaries, bilateral adnexal regions are unremarkable. Other: No intraperitoneal free fluid. No intraperitoneal free gas. No organized fluid collection. Musculoskeletal: No abdominal wall hernia or abnormality. Subcentimeter fluid density lesion along the right flank subcutaneus soft tissues/dermis likely represents a sebaceous cyst. No suspicious lytic or blastic osseous lesions. No acute displaced fracture. Multilevel degenerative changes of the spine. IMPRESSION: Normal appendix with no acute intra-abdominal or intrapelvic findings to explain etiology of patient's symptoms. Electronically Signed   By: Tish FredericksonMorgane  Naveau M.D.   On: 01/27/2020 15:45    Procedures Procedures (including critical care time)  Medications Ordered in ED Medications  lactated ringers bolus 1,000 mL (0 mLs Intravenous Stopped 01/27/20 1655)  iohexol (OMNIPAQUE) 300 MG/ML solution 100 mL (100 mLs Intravenous Contrast Given 01/27/20 1520)    ED Course  I have reviewed the triage vital signs and the nursing notes.  Pertinent labs & imaging results that were available during my care of the patient were reviewed by me and considered in my medical decision making (see chart for details).  Clinical Course as of Jan 26 1901  Tue Jan 27, 2020  1630 Pelvic exam attempted with chaperone in the room.  Every time I brought the speculum near patient she would tense up and pull away up the bed without me even attempting to insert the speculum into the vaginal canal.  Was able to get swabs for GC and wet prep however unable to perform speculum exam, bimanual exam.  This does not appear to be due to pain however as she would react once the speculum touched her.    [EH]    Clinical Course User Index [EH] Norman ClayHammond, Kareena Arrambide W, PA-C   MDM Rules/Calculators/A&P                         Patient is a 29 year old woman who presents today for evaluation of pain in the right sided mid abdomen.  She was seen at urgent care where she reportedly had a negative UA.  CBC shows mild anemia.  BMP, hepatic function panel, and lipase are all unremarkable.  She reportedly had a urine pregnancy test at urgent care which was negative.  Based on the right lower location of her pain CT abdomen pelvis is obtained without appendicitis, significant TOA or adnexal abnormalities or other cause for her symptoms found.  I discussed pelvic exam with patient.  Patient agreed for pelvic exam, however was unable to complete due to her actively moving away from the speculum and clinching anytime the speculum even got near her without even attempts at inserting the speculum.  Was able to obtain swabs for wet prep and GC.  GC testing is sent.  Discussed with patient that exam is limited as she is unable to complete pelvic exam despite best attempts.    CT abdomen pelvis is obtained without evidence of ureterolithiasis, appendicitis, TOA or other significant cause for her symptoms found.  No evidence of significant intra-abdominal or intrapelvic process to cause her pain.  Her pain improved with IV fluids and observation in the emergency room.  Wet prep is positive for clue cells. Patient refused urine pregnancy test and UA here.  Recommended outpatient  OB/GYN follow-up or back in ED sooner if fevers,  worsening pain or other concerns.   Return precautions were discussed with patient who states their understanding.  At the time of discharge patient denied any unaddressed complaints or concerns.  Patient is agreeable for discharge home.  Note: Portions of this report may have been transcribed using voice recognition software. Every effort was made to ensure accuracy; however, inadvertent computerized transcription errors may be present.  Final Clinical Impression(s) / ED Diagnoses Final diagnoses:  Right lower quadrant abdominal pain    Rx / DC Orders ED Discharge Orders         Ordered    metroNIDAZOLE (FLAGYL) 500 MG tablet  2 times daily        01/27/20 1719           Norman Clay 01/27/20 Orlinda Blalock, MD 01/30/20 1009

## 2020-01-27 NOTE — Discharge Instructions (Signed)
Today we were unable to complete a full pelvic exam so you fully been evaluated for a pelvic cause of your symptoms, however your CT scan was reassuring.  Please schedule a follow-up appointment with your OB/GYN and your primary care doctor.  Please make sure you are eating a healthy well-balanced diet and drinking plenty of water.  Today your diagnosed with bacterial vaginosis and received a prescription for metronidazole also known as Flagyl. It is very important that you do not consume any alcohol while taking this medication as it will cause you to become violently ill.

## 2020-01-27 NOTE — Telephone Encounter (Signed)
Patient called in stating that she is unsure whether or not she should go to urgent care or the ED. Patient is having pain on the right side of her body. Patient states that she googled her symptoms and that she thinks it might be a cyst rupture or appendicitis. Was unable to get nurse as she was in the room with patient, informed patient I would send a message back and that the nurse would be in contact with her, patient states she just got in the car and that she is going to ride over to urgent care.

## 2020-01-28 LAB — GC/CHLAMYDIA PROBE AMP (~~LOC~~) NOT AT ARMC
Chlamydia: NEGATIVE
Comment: NEGATIVE
Comment: NORMAL
Neisseria Gonorrhea: NEGATIVE

## 2020-01-28 NOTE — Telephone Encounter (Signed)
Patient called in stating that she was following up from her previous call. Patient went to the ED and have  CT scan done, they ruled out appendicitis and told the patient to follow up with her primary care and recommended they do a ultrasound. It appears that the patient doesn't have a primary care, could you please advise?

## 2020-01-29 ENCOUNTER — Telehealth: Payer: Self-pay

## 2020-01-29 NOTE — Telephone Encounter (Signed)
mychart message sent

## 2020-01-30 ENCOUNTER — Other Ambulatory Visit: Payer: Self-pay

## 2020-01-30 ENCOUNTER — Telehealth: Payer: Self-pay

## 2020-01-30 DIAGNOSIS — R1031 Right lower quadrant pain: Secondary | ICD-10-CM

## 2020-01-30 DIAGNOSIS — R109 Unspecified abdominal pain: Secondary | ICD-10-CM

## 2020-01-30 NOTE — Progress Notes (Signed)
Per Serafina Royals, CNM order for GYN abdominal and transvaginal in clinic placed.

## 2020-01-30 NOTE — Telephone Encounter (Signed)
Ultrasound and appointment. Place order for GYN abdominal and transvaginal in clinic. Thanks, JML

## 2020-01-30 NOTE — Telephone Encounter (Signed)
Per Serafina Royals, CNM u/s ordered. Advised pt when u/s is in office. Pt verbalized understanding . Informed her u/s and JMl are in office on Thursday. Pt agreed for Thursday appointment. Pt states her pain has decreased. Informed her to monitor symptoms.  Pt added to u/s & JML schedule for u/s review. Pt aware.

## 2020-02-05 ENCOUNTER — Ambulatory Visit (INDEPENDENT_AMBULATORY_CARE_PROVIDER_SITE_OTHER): Payer: BC Managed Care – PPO

## 2020-02-05 ENCOUNTER — Encounter: Payer: Self-pay | Admitting: Certified Nurse Midwife

## 2020-02-05 ENCOUNTER — Other Ambulatory Visit: Payer: BC Managed Care – PPO

## 2020-02-05 ENCOUNTER — Ambulatory Visit (INDEPENDENT_AMBULATORY_CARE_PROVIDER_SITE_OTHER): Payer: BC Managed Care – PPO | Admitting: Certified Nurse Midwife

## 2020-02-05 ENCOUNTER — Other Ambulatory Visit: Payer: Self-pay

## 2020-02-05 VITALS — BP 106/85 | HR 63 | Ht 65.5 in | Wt 194.6 lb

## 2020-02-05 DIAGNOSIS — R109 Unspecified abdominal pain: Secondary | ICD-10-CM

## 2020-02-05 DIAGNOSIS — R1031 Right lower quadrant pain: Secondary | ICD-10-CM

## 2020-02-05 DIAGNOSIS — R102 Pelvic and perineal pain: Secondary | ICD-10-CM

## 2020-02-05 NOTE — Progress Notes (Signed)
GYN ENCOUNTER NOTE  Subjective:       Stacy Harding is a 29 y.o. G0P0000 female here for ultrasound review.   Patient seen in  ER on 01/27/2020 for pelvic pain or right lower abdominal pain; for further details, see previous note. Not currently having any pain.   Denies difficulty breathing or respiratory distress, chest pain, abdominal pain, excessive vaginal bleeding, dysuria, and leg pain or swelling.    Gynecologic History  Patient's last menstrual period was 02/01/2020.  Contraception: abstinence  Last Pap: 11/2019. Results were: ASC-US/HPV negative  Obstetric History  OB History  Gravida Para Term Preterm AB Living  0 0 0 0 0 0  SAB TAB Ectopic Multiple Live Births  0 0 0 0 0    Past Medical History:  Diagnosis Date  . Anemia   . Herpes simplex type 1 infection     Past Surgical History:  Procedure Laterality Date  . umilical hernia      Current Outpatient Medications on File Prior to Visit  Medication Sig Dispense Refill  . Ascorbic Acid (VITAMIN C) 100 MG tablet Take 100 mg by mouth daily.    . Beta Carotene (VITAMIN A) 25000 UNIT capsule Take 25,000 Units by mouth daily.    . cholecalciferol (VITAMIN D3) 25 MCG (1000 UNIT) tablet Take 1,000 Units by mouth daily.    . ferrous sulfate 324 MG TBEC Take 324 mg by mouth.    . metroNIDAZOLE (FLAGYL) 500 MG tablet Take 1 tablet (500 mg total) by mouth 2 (two) times daily. 14 tablet 0  . vitamin B-12 (CYANOCOBALAMIN) 100 MCG tablet Take 100 mcg by mouth daily.     No current facility-administered medications on file prior to visit.    No Known Allergies  Social History   Socioeconomic History  . Marital status: Single    Spouse name: Not on file  . Number of children: Not on file  . Years of education: Not on file  . Highest education level: Not on file  Occupational History  . Not on file  Tobacco Use  . Smoking status: Never Smoker  . Smokeless tobacco: Never Used  Vaping Use  . Vaping Use:  Never used  Substance and Sexual Activity  . Alcohol use: Not Currently  . Drug use: Never  . Sexual activity: Not Currently    Birth control/protection: None  Other Topics Concern  . Not on file  Social History Narrative  . Not on file   Social Determinants of Health   Financial Resource Strain:   . Difficulty of Paying Living Expenses: Not on file  Food Insecurity:   . Worried About Programme researcher, broadcasting/film/video in the Last Year: Not on file  . Ran Out of Food in the Last Year: Not on file  Transportation Needs:   . Lack of Transportation (Medical): Not on file  . Lack of Transportation (Non-Medical): Not on file  Physical Activity:   . Days of Exercise per Week: Not on file  . Minutes of Exercise per Session: Not on file  Stress:   . Feeling of Stress : Not on file  Social Connections:   . Frequency of Communication with Friends and Family: Not on file  . Frequency of Social Gatherings with Friends and Family: Not on file  . Attends Religious Services: Not on file  . Active Member of Clubs or Organizations: Not on file  . Attends Banker Meetings: Not on file  . Marital Status: Not  on file  Intimate Partner Violence:   . Fear of Current or Ex-Partner: Not on file  . Emotionally Abused: Not on file  . Physically Abused: Not on file  . Sexually Abused: Not on file    Family History  Problem Relation Age of Onset  . Breast cancer Maternal Aunt   . Cancer Maternal Aunt   . Thyroid disease Maternal Aunt   . Stroke Maternal Uncle   . Diabetes Paternal Aunt     The following portions of the patient's history were reviewed and updated as appropriate: allergies, current medications, past family history, past medical history, past social history, past surgical history and problem list.  Review of Systems  ROS negative except as noted above. Information obtained from patient.   Objective:   BP 106/85   Pulse 63   Ht 5' 5.5" (1.664 m)   Wt 194 lb 9.6 oz (88.3 kg)    LMP 02/01/2020   BMI 31.89 kg/m   CONSTITUTIONAL: Well-developed, well-nourished female in no acute distress.   PHYSICAL EXAM: Not indicated.   ULTRASOUND REPORT  Location: [facility]  Date of Service: 02/05/2020   Indications:Pelvic Pain   Findings:  The uterus is anteverted and measures 8.3 x 3.5 x 4.9 cm. Echo texture is homogenous without evidence of focal masses.  The Endometrium measures 7 mm.  Right Ovary measures 2.1 x 2.1 x 2.1 cm. It is normal in appearance. Left Ovary measures 3.6 x 2.3 x 2.7 cm. It is normal in appearance. Survey of the adnexa demonstrates no adnexal masses. There is no free fluid in the cul de sac.  Impression: 1. Pelvic ultrasound is WNL.  Recommendations: 1.Clinical correlation with the patient's History and Physical Exam.  Assessment:   1. Pelvic pain  Plan:   Ultrasound findings reviewed with patient, verbalized understanding.   Education regarding ovarian cyst and pelvic pain, see AVS.   Reviewed red flag symptoms and when to call.   RTC as previously scheduled or sooner if needed.    Serafina Royals, CNM [facility] 02/05/20 5:46 PM

## 2020-02-05 NOTE — Patient Instructions (Signed)
Ovarian Cyst     An ovarian cyst is a fluid-filled sac that forms on an ovary. The ovaries are small organs that produce eggs in women. Various types of cysts can form on the ovaries. Some may cause symptoms and require treatment. Most ovarian cysts go away on their own, are not cancerous (are benign), and do not cause problems. Common types of ovarian cysts include:  Functional (follicle) cysts. ? Occur during the menstrual cycle, and usually go away with the next menstrual cycle if you do not get pregnant. ? Usually cause no symptoms.  Endometriomas. ? Are cysts that form from the tissue that lines the uterus (endometrium). ? Are sometimes called "chocolate cysts" because they become filled with blood that turns brown. ? Can cause pain in the lower abdomen during intercourse and during your period.  Cystadenoma cysts. ? Develop from cells on the outside surface of the ovary. ? Can get very large and cause lower abdomen pain and pain with intercourse. ? Can cause severe pain if they twist or break open (rupture).  Dermoid cysts. ? Are sometimes found in both ovaries. ? May contain different kinds of body tissue, such as skin, teeth, hair, or cartilage. ? Usually do not cause symptoms unless they get very big.  Theca lutein cysts. ? Occur when too much of a certain hormone (human chorionic gonadotropin) is produced and overstimulates the ovaries to produce an egg. ? Are most common after having procedures used to assist with the conception of a baby (in vitro fertilization). What are the causes? Ovarian cysts may be caused by:  Ovarian hyperstimulation syndrome. This is a condition that can develop from taking fertility medicines. It causes multiple large ovarian cysts to form.  Polycystic ovarian syndrome (PCOS). This is a common hormonal disorder that can cause ovarian cysts, as well as problems with your period or fertility. What increases the risk? The following factors may  make you more likely to develop ovarian cysts:  Being overweight or obese.  Taking fertility medicines.  Taking certain forms of hormonal birth control.  Smoking. What are the signs or symptoms? Many ovarian cysts do not cause symptoms. If symptoms are present, they may include:  Pelvic pain or pressure.  Pain in the lower abdomen.  Pain during sex.  Abdominal swelling.  Abnormal menstrual periods.  Increasing pain with menstrual periods. How is this diagnosed? These cysts are commonly found during a routine pelvic exam. You may have tests to find out more about the cyst, such as:  Ultrasound.  X-ray of the pelvis.  CT scan.  MRI.  Blood tests. How is this treated? Many ovarian cysts go away on their own without treatment. Your health care provider may want to check your cyst regularly for 2-3 months to see if it changes. If you are in menopause, it is especially important to have your cyst monitored closely because menopausal women have a higher rate of ovarian cancer. When treatment is needed, it may include:  Medicines to help relieve pain.  A procedure to drain the cyst (aspiration).  Surgery to remove the whole cyst.  Hormone treatment or birth control pills. These methods are sometimes used to help dissolve a cyst. Follow these instructions at home:  Take over-the-counter and prescription medicines only as told by your health care provider.  Do not drive or use heavy machinery while taking prescription pain medicine.  Get regular pelvic exams and Pap tests as often as told by your health care provider.    Return to your normal activities as told by your health care provider. Ask your health care provider what activities are safe for you.  Do not use any products that contain nicotine or tobacco, such as cigarettes and e-cigarettes. If you need help quitting, ask your health care provider.  Keep all follow-up visits as told by your health care provider.  This is important. Contact a health care provider if:  Your periods are late, irregular, or painful, or they stop.  You have pelvic pain that does not go away.  You have pressure on your bladder or trouble emptying your bladder completely.  You have pain during sex.  You have any of the following in your abdomen: ? A feeling of fullness. ? Pressure. ? Discomfort. ? Pain that does not go away. ? Swelling.  You feel generally ill.  You become constipated.  You lose your appetite.  You develop severe acne.  You start to have more body hair and facial hair.  You are gaining weight or losing weight without changing your exercise and eating habits.  You think you may be pregnant. Get help right away if:  You have abdominal pain that is severe or gets worse.  You cannot eat or drink without vomiting.  You suddenly develop a fever.  Your menstrual period is much heavier than usual. This information is not intended to replace advice given to you by your health care provider. Make sure you discuss any questions you have with your health care provider. Document Revised: 06/11/2017 Document Reviewed: 08/15/2015 Elsevier Patient Education  2020 Elsevier Inc.   Pelvic Pain, Female Pelvic pain is pain in your lower belly (abdomen), below your belly button and between your hips. The pain may start suddenly (be acute), keep coming back (be recurring), or last a long time (become chronic). Pelvic pain that lasts longer than 6 months is called chronic pelvic pain. There are many causes of pelvic pain. Sometimes the cause of pelvic pain is not known. Follow these instructions at home:   Take over-the-counter and prescription medicines only as told by your doctor.  Rest as told by your doctor.  Do not have sex if it hurts.  Keep a journal of your pelvic pain. Write down: ? When the pain started. ? Where the pain is located. ? What seems to make the pain better or worse, such as  food or your period (menstrual cycle). ? Any symptoms you have along with the pain.  Keep all follow-up visits as told by your doctor. This is important. Contact a doctor if:  Medicine does not help your pain.  Your pain comes back.  You have new symptoms.  You have unusual discharge or bleeding from your vagina.  You have a fever or chills.  You are having trouble pooping (constipation).  You have blood in your pee (urine) or poop (stool).  Your pee smells bad.  You feel weak or light-headed. Get help right away if:  You have sudden pain that is very bad.  Your pain keeps getting worse.  You have very bad pain and also have any of these symptoms: ? A fever. ? Feeling sick to your stomach (nausea). ? Throwing up (vomiting). ? Being very sweaty.  You pass out (lose consciousness). Summary  Pelvic pain is pain in your lower belly (abdomen), below your belly button and between your hips.  There are many possible causes of pelvic pain.  Keep a journal of your pelvic pain. This information is  not intended to replace advice given to you by your health care provider. Make sure you discuss any questions you have with your health care provider. Document Revised: 08/29/2017 Document Reviewed: 08/29/2017 Elsevier Patient Education  2020 [employer].

## 2020-02-18 DIAGNOSIS — Z0000 Encounter for general adult medical examination without abnormal findings: Secondary | ICD-10-CM | POA: Diagnosis not present

## 2020-02-18 DIAGNOSIS — D509 Iron deficiency anemia, unspecified: Secondary | ICD-10-CM | POA: Diagnosis not present

## 2020-08-05 DIAGNOSIS — N921 Excessive and frequent menstruation with irregular cycle: Secondary | ICD-10-CM | POA: Diagnosis not present

## 2020-08-05 DIAGNOSIS — D509 Iron deficiency anemia, unspecified: Secondary | ICD-10-CM | POA: Diagnosis not present

## 2020-08-06 ENCOUNTER — Ambulatory Visit (INDEPENDENT_AMBULATORY_CARE_PROVIDER_SITE_OTHER): Payer: BC Managed Care – PPO | Admitting: Certified Nurse Midwife

## 2020-08-06 ENCOUNTER — Encounter: Payer: Self-pay | Admitting: Certified Nurse Midwife

## 2020-08-06 ENCOUNTER — Other Ambulatory Visit: Payer: Self-pay

## 2020-08-06 VITALS — BP 113/74 | HR 63 | Ht 65.0 in | Wt 191.0 lb

## 2020-08-06 DIAGNOSIS — F439 Reaction to severe stress, unspecified: Secondary | ICD-10-CM | POA: Diagnosis not present

## 2020-08-06 DIAGNOSIS — Z6831 Body mass index (BMI) 31.0-31.9, adult: Secondary | ICD-10-CM

## 2020-08-06 DIAGNOSIS — N926 Irregular menstruation, unspecified: Secondary | ICD-10-CM

## 2020-08-06 DIAGNOSIS — N921 Excessive and frequent menstruation with irregular cycle: Secondary | ICD-10-CM

## 2020-08-06 NOTE — Patient Instructions (Signed)
Menorrhagia Menorrhagia is a form of abnormal uterine bleeding in which menstrual periods are heavy or last longer than normal. With menorrhagia, the periods may cause enough blood loss and cramping that a woman becomes unable to take part in her usual activities. What are the causes? Common causes of this condition include:  Polyps or fibroids. These are noncancerous growths in the uterus.  An imbalance of the hormones estrogen and progesterone.  Anovulation, which occurs when one of the ovaries does not release an egg during one or more months.  A problem with the thyroid gland (hypothyroidism).  Side effects of having an intrauterine device (IUD).  Side effects of some medicines, such as NSAIDs or blood thinners.  A bleeding disorder that stops the blood from clotting normally. In some cases, the cause of this condition is not known. What increases the risk? You are more likely to develop this condition if you have cancer of the uterus. What are the signs or symptoms? Symptoms of this condition include:  Routinely having to change your pad or tampon every 1-2 hours because it is soaked.  Needing to use pads and tampons at the same time because of heavy bleeding.  Needing to wake up to change your pads or tampons during the night.  Passing blood clots larger than 1 inch (2.5 cm) in size.  Having bleeding that lasts for more than 7 days.  Having symptoms of low iron levels (anemia), such as tiredness (fatigue) or shortness of breath. How is this diagnosed? This condition may be diagnosed based on:  A physical exam.  Your symptoms and menstrual history.  Tests, such as: ? Blood tests to check if you are pregnant or if you have hormonal changes, a bleeding or thyroid disorder, anemia, or other problems. ? Pap test to check for cancerous changes, infections, or inflammation. ? Endometrial biopsy. This test involves removing a tissue sample from the lining of the uterus  (endometrium) to be examined under a microscope. ? Pelvic ultrasound. This test uses sound waves to create images of your uterus, ovaries, and vagina. The images can show if you have fibroids or other growths. ? Hysteroscopy. For this test, a thin, flexible tube with a light on the end (hysteroscope) is used to look inside your uterus. How is this treated? Treatment may not be needed for this condition. If it is needed, the best treatment for you will depend on:  Whether you need to prevent pregnancy.  Your desire to have children in the future.  The cause and severity of your bleeding.  Your personal preference. Medicine Medicines are the first step in treatment. You may be treated with:  Hormonal birth control methods. These treatments reduce bleeding during your menstrual period. They include: ? Birth control pills. ? Skin patch. ? Vaginal ring. ? Shots (injections) that you get every 3 months. ? Hormonal IUD. ? Implants that go under the skin.  Medicines that thicken the blood and slow bleeding.  Medicines that reduce swelling, such as ibuprofen.  Medicines that contain an artificial (synthetic) hormone called progestin.  Medicines that make the ovaries stop working for a short time.  Iron supplements to treat anemia.   Surgery If medicines do not work, surgery may be done. Surgical options may include:  Dilation and curettage (D&C). In this procedure, your health care provider opens the lowest part of the uterus (cervix) and then scrapes or suctions tissue from the endometrium. This reduces menstrual bleeding.  Operative hysteroscopy. In this procedure,   a hysteroscope is used to view your uterus and help remove polyps that may be causing heavy periods.  Endometrial ablation. This is when various techniques are used to permanently destroy your entire endometrium. After endometrial ablation, most women have little or no menstrual flow. This procedure reduces your ability to  become pregnant.  Endometrial resection. In this procedure, an electrosurgical wire loop is used to remove the endometrium. This procedure reduces your ability to become pregnant.  Hysterectomy. This is surgical removal of your uterus. This is a permanent procedure that stops menstrual periods. Pregnancy is not possible after a hysterectomy. Follow these instructions at home: Medicines  Take over-the-counter and prescription medicines only as told by your health care provider. This includes iron pills.  Do not change or switch medicines without asking your health care provider.  Do not take aspirin or medicines that contain aspirin 1 week before or during your menstrual period. Aspirin may make bleeding worse. Managing constipation Your iron pills may cause constipation. If you are taking prescription iron supplements, you may need to take these actions to prevent or treat constipation:  Drink enough fluid to keep your urine pale yellow.  Take over-the-counter or prescription medicines.  Eat foods that are high in fiber, such as beans, whole grains, and fresh fruits and vegetables.  Limit foods that are high in fat and processed sugars, such as fried or sweet foods. General instructions  If you need to change your sanitary pad or tampon more than once every 2 hours, limit your activity until the bleeding stops.  Eat well-balanced meals, including foods that are high in iron. Foods that have a lot of iron include leafy green vegetables, meat, liver, eggs, and whole-grain breads and cereals.  Do not try to lose weight until the abnormal bleeding has stopped and your blood iron level is back to normal. If you need to lose weight, work with your health care provider to lose weight safely.  Keep all follow-up visits. This is important. Contact a health care provider if:  You soak through a pad or tampon every 1 or 2 hours, and this happens every time you have a period.  You need to use  pads and tampons at the same time because you are bleeding so much.  You have nausea, vomiting, diarrhea, or other problems related to medicines you are taking. Get help right away if:  You soak through more than a pad or tampon in 1 hour.  You pass clots bigger than 1 inch (2.5 cm) wide.  You feel short of breath.  You feel like your heart is beating too fast.  You feel dizzy or you faint.  You feel very weak or tired. Summary  Menorrhagia is a form of abnormal uterine bleeding in which menstrual periods are heavy or last longer than normal.  Treatment may not be needed for this condition. If it is needed, it may include medicines or procedures.  Take over-the-counter and prescription medicines only as told by your health care provider. This includes iron pills.  Get help right away if you have heavy bleeding that soaks through more than a pad or tampon in 1 hour, you pass large clots, or you feel dizzy, short of breath, or very weak or tired. This information is not intended to replace advice given to you by your health care provider. Make sure you discuss any questions you have with your health care provider. Document Revised: 11/25/2019 Document Reviewed: 11/25/2019 Elsevier Patient Education  2021   [place] 28-76 Years Old, Female Preventive care refers to lifestyle choices and visits with your health care provider that can promote health and wellness. This includes:  A yearly physical exam. This is also called an annual wellness visit.  Regular dental and eye exams.  Immunizations.  Screening for certain conditions.  Healthy lifestyle choices, such as: ? Eating a healthy diet. ? Getting regular exercise. ? Not using drugs or products that contain nicotine and tobacco. ? Limiting alcohol use. What can I expect for my preventive care visit? Physical exam Your health care provider may check your:  Height and weight. These may be used to  calculate your BMI (body mass index). BMI is a measurement that tells if you are at a healthy weight.  Heart rate and blood pressure.  Body temperature.  Skin for abnormal spots. Counseling Your health care provider may ask you questions about your:  Past medical problems.  Family's medical history.  Alcohol, tobacco, and drug use.  Emotional well-being.  Home life and relationship well-being.  Sexual activity.  Diet, exercise, and sleep habits.  Work and work Statistician.  Access to firearms.  Method of birth control.  Menstrual cycle.  Pregnancy history. What immunizations do I need? Vaccines are usually given at various ages, according to a schedule. Your health care provider will recommend vaccines for you based on your age, medical history, and lifestyle or other factors, such as travel or where you work.   What tests do I need? Blood tests  Lipid and cholesterol levels. These may be checked every 5 years starting at age 44.  Hepatitis C test.  Hepatitis B test. Screening  Diabetes screening. This is done by checking your blood sugar (glucose) after you have not eaten for a while (fasting).  STD (sexually transmitted disease) testing, if you are at risk.  BRCA-related cancer screening. This may be done if you have a family history of breast, ovarian, tubal, or peritoneal cancers.  Pelvic exam and Pap test. This may be done every 3 years starting at age 48. Starting at age 33, this may be done every 5 years if you have a Pap test in combination with an HPV test. Talk with your health care provider about your test results, treatment options, and if necessary, the need for more tests.   Follow these instructions at home: Eating and drinking  Eat a healthy diet that includes fresh fruits and vegetables, whole grains, lean protein, and low-fat dairy products.  Take vitamin and mineral supplements as recommended by your health care provider.  Do not drink  alcohol if: ? Your health care provider tells you not to drink. ? You are pregnant, may be pregnant, or are planning to become pregnant.  If you drink alcohol: ? Limit how much you have to 0-1 drink a day. ? Be aware of how much alcohol is in your drink. In the [place] one drink equals one 12 oz bottle of beer (355 mL), one 5 oz glass of wine (148 mL), or one 1 oz glass of hard liquor (44 mL).   Lifestyle  Take daily care of your teeth and gums. Brush your teeth every morning and night with fluoride toothpaste. Floss one time each day.  Stay active. Exercise for at least 30 minutes 5 or more days each week.  Do not use any products that contain nicotine or tobacco, such as cigarettes, e-cigarettes, and chewing tobacco. If you need help quitting, ask your health  care provider.  Do not use drugs.  If you are sexually active, practice safe sex. Use a condom or other form of protection to prevent STIs (sexually transmitted infections).  If you do not wish to become pregnant, use a form of birth control. If you plan to become pregnant, see your health care provider for a prepregnancy visit.  Find healthy ways to cope with stress, such as: ? Meditation, yoga, or listening to music. ? Journaling. ? Talking to a trusted person. ? Spending time with friends and family. Safety  Always wear your seat belt while driving or riding in a vehicle.  Do not drive: ? If you have been drinking alcohol. Do not ride with someone who has been drinking. ? When you are tired or distracted. ? While texting.  Wear a helmet and other protective equipment during sports activities.  If you have firearms in your house, make sure you follow all gun safety procedures.  Seek help if you have been physically or sexually abused. What's next?  Go to your health care provider once a year for an annual wellness visit.  Ask your health care provider how often you should have your eyes and teeth checked.  Stay  up to date on all vaccines. This information is not intended to replace advice given to you by your health care provider. Make sure you discuss any questions you have with your health care provider. Document Revised: 11/09/2019 Document Reviewed: 11/22/2017 Elsevier Patient Education  2021 [employer].

## 2020-08-06 NOTE — Progress Notes (Signed)
GYN ENCOUNTER NOTE  Subjective:       Stacy Harding is a 30 y.o. G0P0000 female is here for gynecologic evaluation of the following issues:   1.  Wants to discuss options to better manage irregular heavy periods that are causing chest pain associated with anemia.  2. Stressed from working and school full-time  Denies difficulty breathing or respiratory distress, chest pain, and/or leg pain or swelling.   Gynecologic History  No LMP recorded. (Menstrual status: Irregular Periods).Period Duration (Days): 6 weeks Period Pattern: (!) Irregular Menstrual Flow: Heavy,Moderate Menstrual Control: Maxi pad Dysmenorrhea: None   Contraception: none   Last Pap: 12/12/2019. Results were: ASC-US/ HPV- Neg.  Obstetric History  OB History  Gravida Para Term Preterm AB Living  0 0 0 0 0 0  SAB IAB Ectopic Multiple Live Births  0 0 0 0 0    Past Medical History:  Diagnosis Date  . Anemia   . Herpes simplex type 1 infection     Past Surgical History:  Procedure Laterality Date  . umilical hernia      Current Outpatient Medications on File Prior to Visit  Medication Sig Dispense Refill  . Ascorbic Acid (VITAMIN C) 100 MG tablet Take 100 mg by mouth daily.    . Beta Carotene (VITAMIN A) 25000 UNIT capsule Take 25,000 Units by mouth daily.    . cholecalciferol (VITAMIN D3) 25 MCG (1000 UNIT) tablet Take 1,000 Units by mouth daily.    . ferrous sulfate 324 MG TBEC Take 324 mg by mouth.    . vitamin B-12 (CYANOCOBALAMIN) 100 MCG tablet Take 100 mcg by mouth daily.     No current facility-administered medications on file prior to visit.    No Known Allergies  Social History   Socioeconomic History  . Marital status: Single    Spouse name: Not on file  . Number of children: Not on file  . Years of education: Not on file  . Highest education level: Not on file  Occupational History  . Not on file  Tobacco Use  . Smoking status: Never Smoker  . Smokeless tobacco: Never Used   Vaping Use  . Vaping Use: Never used  Substance and Sexual Activity  . Alcohol use: Not Currently  . Drug use: Never  . Sexual activity: Not Currently    Birth control/protection: None  Other Topics Concern  . Not on file  Social History Narrative  . Not on file   Social Determinants of Health   Financial Resource Strain: Not on file  Food Insecurity: Not on file  Transportation Needs: Not on file  Physical Activity: Not on file  Stress: Not on file  Social Connections: Not on file  Intimate Partner Violence: Not on file    Family History  Problem Relation Age of Onset  . Breast cancer Maternal Aunt   . Cancer Maternal Aunt   . Thyroid disease Maternal Aunt   . Stroke Maternal Uncle   . Diabetes Paternal Aunt     The following portions of the patient's history were reviewed and updated as appropriate: allergies, current medications, past family history, past medical history, past social history, past surgical history and problem list.  Review of Systems  ROS- Negative other than what was reported above. Information obtained verbally from patient.  Objective:   BP 113/74   Pulse 63   Ht 5\' 5"  (1.651 m)   Wt 86.6 kg   BMI 31.78 kg/m    CONSTITUTIONAL: Well-developed,  well-nourished female in no acute distress.   PHYSICAL EXAM: Not indicated.  Depression screen [facility] 2/9 08/06/2020 08/06/2020  Decreased Interest 1 0  Down, Depressed, Hopeless 0 0  PHQ - 2 Score 1 0  Altered sleeping 1 -  Tired, decreased energy 2 -  Change in appetite 2 -  Feeling bad or failure about yourself  0 -  Trouble concentrating 1 -  Moving slowly or fidgety/restless 0 -  Suicidal thoughts 0 -  PHQ-9 Score 7 -  Difficult doing work/chores Somewhat difficult -    GAD 7 : Generalized Anxiety Score 08/06/2020  Nervous, Anxious, on Edge 1  Control/stop worrying 1  Worry too much - different things 1  Trouble relaxing 1  Restless 0  Easily annoyed or irritable 1  Afraid - awful  might happen 2  Total GAD 7 Score 7  Anxiety Difficulty Not difficult at all    Assessment:   1. Irregular bleeding  2. Menorrhagia with irregular cycle  3. BMI 31.0-31.9,adult  4. Stress   Plan:   Discussed birth control and holistic options; patient desires a holistic approach : Magnesium, Ashwagandha (at night), Red raspberry leaf tea, chasteberry (in pill form) daily.   Samples of LoLoestrin Fe given, will report back via my chart if rx is desired.  My chart message sent with link to bundled supplements, by CNM  Patient is open to counseling and will check what providers her insurance covers.  Reviewed red flag symptoms and when to call  RTC if symptoms worsen or do not improve, or sooner if needed.   Juliann Pares, Student-Mid[employer]ife [employer] 08/06/20 3:33 PM

## 2020-08-06 NOTE — Progress Notes (Signed)
I have seen, interviewed, and examined the patient in conjunction with the [employer] and affirm the diagnosis and management plan.   Gunnar Bulla, CNM [facility], [facility] 08/06/20 4:50 PM

## 2020-08-26 DIAGNOSIS — Z20822 Contact with and (suspected) exposure to covid-19: Secondary | ICD-10-CM | POA: Diagnosis not present

## 2020-08-26 DIAGNOSIS — Z03818 Encounter for observation for suspected exposure to other biological agents ruled out: Secondary | ICD-10-CM | POA: Diagnosis not present

## 2020-10-01 ENCOUNTER — Telehealth: Payer: Self-pay | Admitting: Certified Nurse Midwife

## 2020-10-01 NOTE — Telephone Encounter (Signed)
Pt called requesting a medication be sent in Medication: loestrolo?  Pharm: [facility] on [address] in [place]. ("The one listed in my chart")

## 2020-10-04 ENCOUNTER — Other Ambulatory Visit: Payer: Self-pay | Admitting: Certified Nurse Midwife

## 2020-10-04 DIAGNOSIS — Z3041 Encounter for surveillance of contraceptive pills: Secondary | ICD-10-CM

## 2020-10-04 MED ORDER — LO LOESTRIN FE 1 MG-10 MCG / 10 MCG PO TABS
1.0000 | ORAL_TABLET | Freq: Every day | ORAL | 4 refills | Status: AC
Start: 2020-10-04 — End: ?

## 2020-10-04 NOTE — Progress Notes (Signed)
Rx Lo Loestrin, see orders.    Serafina Royals, CNM [facility], [facility] 10/04/20 9:18 AM

## 2020-10-04 NOTE — Telephone Encounter (Signed)
Please contact patient. Medication sent to pharmacy on file. Advise to download co-pay card and pay out of pocket if medication not covered by insurance. Thanks, JML

## 2020-10-04 NOTE — Telephone Encounter (Signed)
Patient called.  Patient aware.  

## 2020-10-15 DIAGNOSIS — Z20822 Contact with and (suspected) exposure to covid-19: Secondary | ICD-10-CM | POA: Diagnosis not present

## 2020-10-15 DIAGNOSIS — Z03818 Encounter for observation for suspected exposure to other biological agents ruled out: Secondary | ICD-10-CM | POA: Diagnosis not present

## 2020-12-17 ENCOUNTER — Encounter: Payer: BC Managed Care – PPO | Admitting: Certified Nurse Midwife

## 2020-12-23 ENCOUNTER — Encounter: Payer: BC Managed Care – PPO | Admitting: Certified Nurse Midwife

## 2021-02-26 DIAGNOSIS — H52223 Regular astigmatism, bilateral: Secondary | ICD-10-CM | POA: Diagnosis not present

## 2021-04-18 DIAGNOSIS — Z13220 Encounter for screening for lipoid disorders: Secondary | ICD-10-CM | POA: Diagnosis not present

## 2021-04-18 DIAGNOSIS — D509 Iron deficiency anemia, unspecified: Secondary | ICD-10-CM | POA: Diagnosis not present

## 2021-04-18 DIAGNOSIS — N921 Excessive and frequent menstruation with irregular cycle: Secondary | ICD-10-CM | POA: Diagnosis not present

## 2021-05-10 DIAGNOSIS — M545 Low back pain, unspecified: Secondary | ICD-10-CM | POA: Diagnosis not present

## 2021-05-19 DIAGNOSIS — R293 Abnormal posture: Secondary | ICD-10-CM | POA: Diagnosis not present

## 2021-05-19 DIAGNOSIS — M545 Low back pain, unspecified: Secondary | ICD-10-CM | POA: Diagnosis not present

## 2021-05-19 DIAGNOSIS — M625A2 Muscle wasting and atrophy, not elsewhere classified, back, lumbosacral: Secondary | ICD-10-CM | POA: Diagnosis not present

## 2021-05-19 DIAGNOSIS — M2569 Stiffness of other specified joint, not elsewhere classified: Secondary | ICD-10-CM | POA: Diagnosis not present

## 2021-05-26 DIAGNOSIS — M625A2 Muscle wasting and atrophy, not elsewhere classified, back, lumbosacral: Secondary | ICD-10-CM | POA: Diagnosis not present

## 2021-05-26 DIAGNOSIS — R293 Abnormal posture: Secondary | ICD-10-CM | POA: Diagnosis not present

## 2021-05-26 DIAGNOSIS — M2569 Stiffness of other specified joint, not elsewhere classified: Secondary | ICD-10-CM | POA: Diagnosis not present

## 2021-05-26 DIAGNOSIS — M545 Low back pain, unspecified: Secondary | ICD-10-CM | POA: Diagnosis not present

## 2021-06-06 DIAGNOSIS — R293 Abnormal posture: Secondary | ICD-10-CM | POA: Diagnosis not present

## 2021-06-06 DIAGNOSIS — M2569 Stiffness of other specified joint, not elsewhere classified: Secondary | ICD-10-CM | POA: Diagnosis not present

## 2021-06-06 DIAGNOSIS — M625A2 Muscle wasting and atrophy, not elsewhere classified, back, lumbosacral: Secondary | ICD-10-CM | POA: Diagnosis not present

## 2021-06-06 DIAGNOSIS — M545 Low back pain, unspecified: Secondary | ICD-10-CM | POA: Diagnosis not present

## 2021-08-10 DIAGNOSIS — R87610 Atypical squamous cells of undetermined significance on cytologic smear of cervix (ASC-US): Secondary | ICD-10-CM | POA: Diagnosis not present

## 2021-08-10 DIAGNOSIS — Z01419 Encounter for gynecological examination (general) (routine) without abnormal findings: Secondary | ICD-10-CM | POA: Diagnosis not present

## 2021-08-10 DIAGNOSIS — N644 Mastodynia: Secondary | ICD-10-CM | POA: Diagnosis not present

## 2021-08-10 DIAGNOSIS — R635 Abnormal weight gain: Secondary | ICD-10-CM | POA: Diagnosis not present

## 2021-08-10 DIAGNOSIS — N926 Irregular menstruation, unspecified: Secondary | ICD-10-CM | POA: Diagnosis not present

## 2021-10-06 IMAGING — CT CT ABD-PELV W/ CM
2 of 4 series · 16 of 46 positions shown, 18 images · IV contrast (Omnipaque)
Comparison: None

CLINICAL DATA: Right lower quadrant pain, appendicitis suspected

EXAM:
CT ABDOMEN AND PELVIS WITH CONTRAST
TECHNIQUE: Multidetector CT imaging of the abdomen and pelvis was performed
using the standard protocol following bolus administration of
intravenous contrast.
CONTRAST:  100mL OMNIPAQUE IOHEXOL 300 MG/ML  SOLN

[Series 2: axial st · axial · 0.94mm/px · z∈[+738,+1188]mm · 13 of 100 slices shown, 15 images]
[im 5/100  soft-tissue]
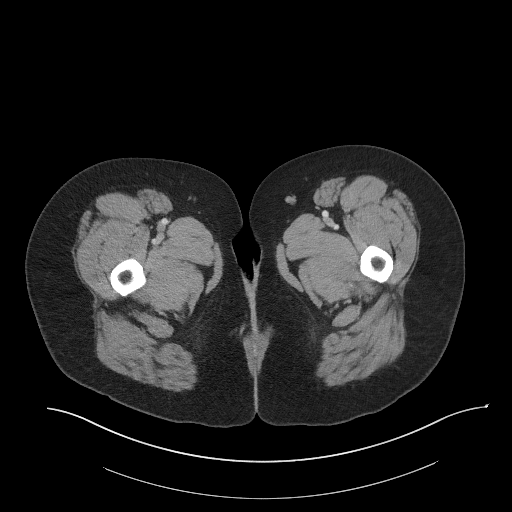
[im 5/100  bone]
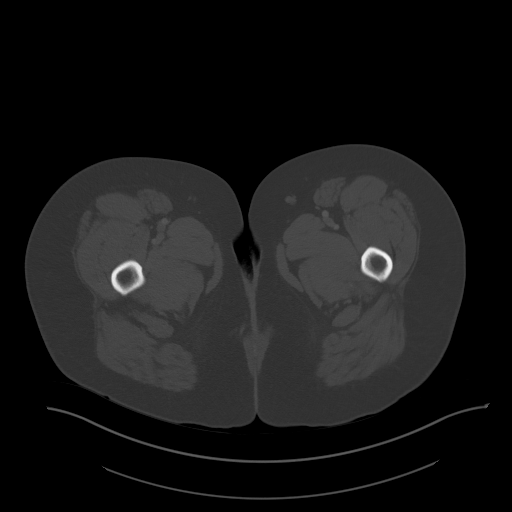
[im 13/100  soft-tissue]
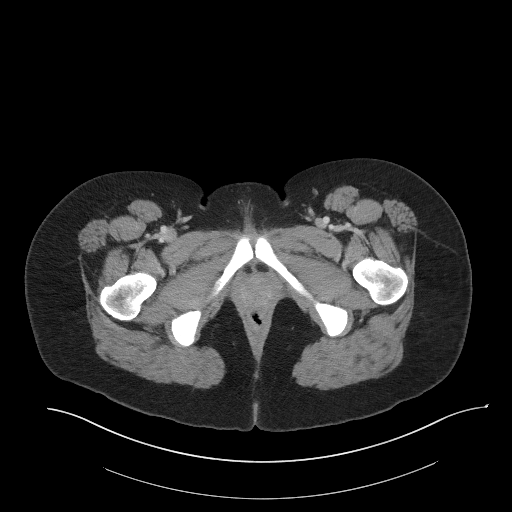
[im 21/100  soft-tissue]
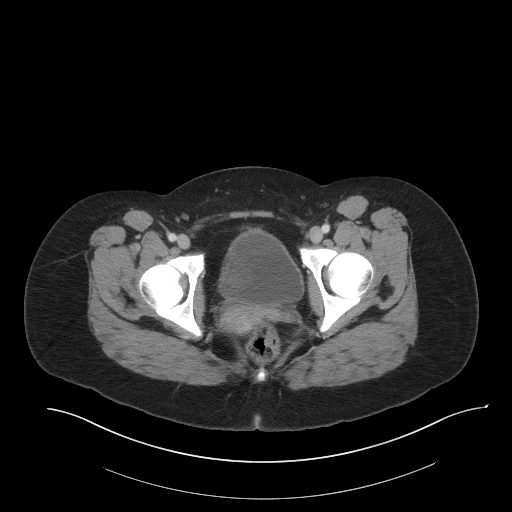
[im 29/100  soft-tissue]
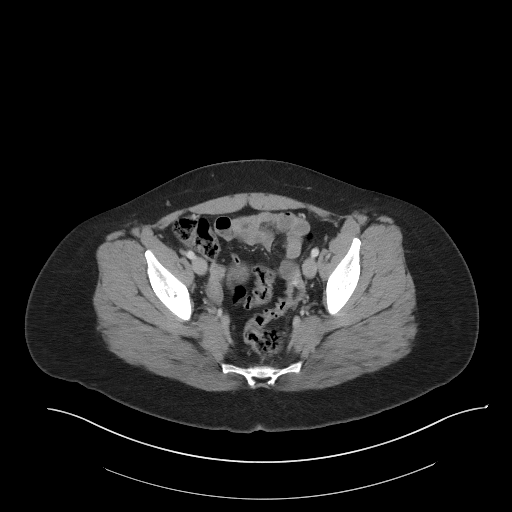
[im 34/100  soft-tissue]
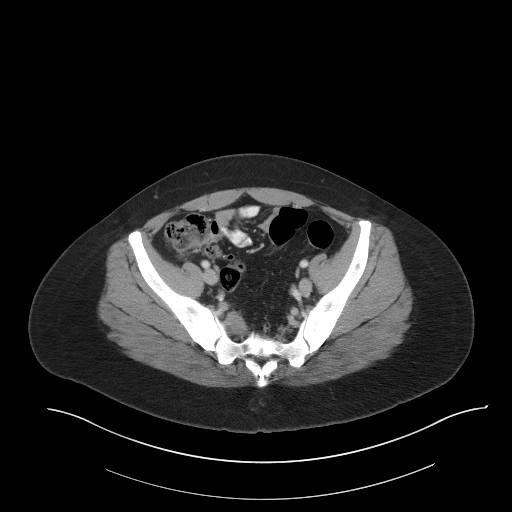
[im 42/100  soft-tissue]
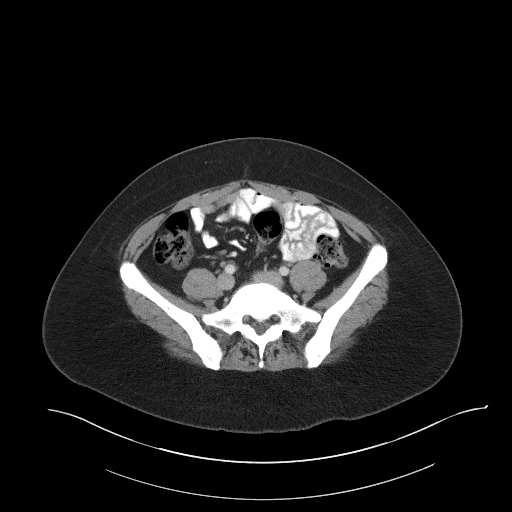
[im 50/100  soft-tissue]
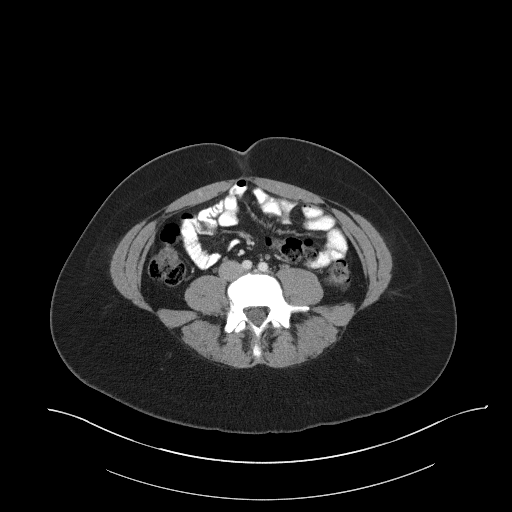
[im 58/100  soft-tissue]
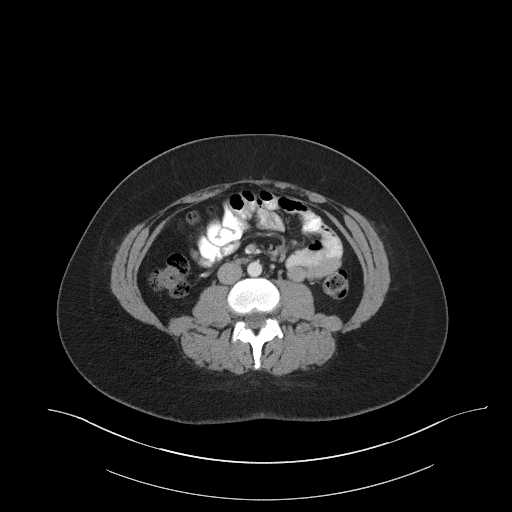
[im 67/100  soft-tissue]
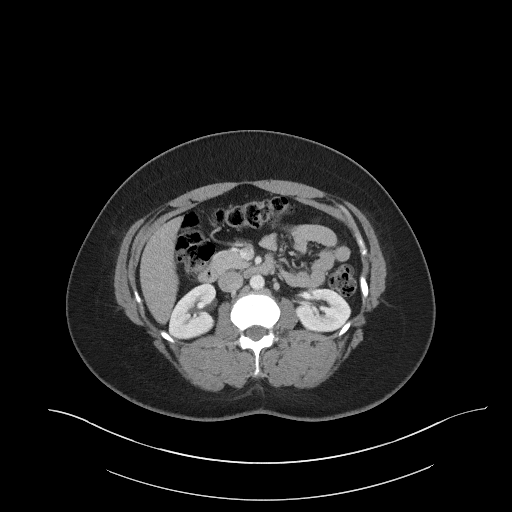
[im 67/100  bone]
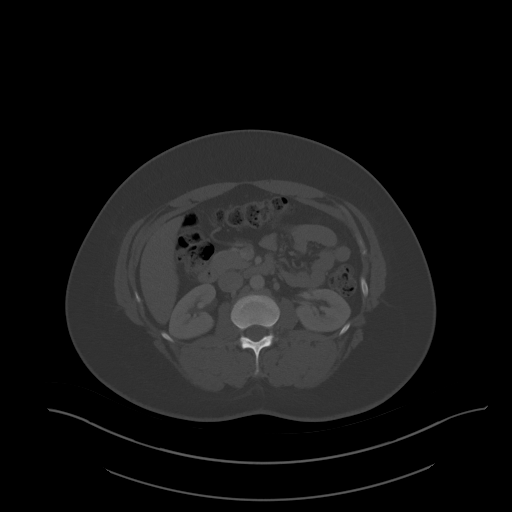
[im 71/100  soft-tissue]
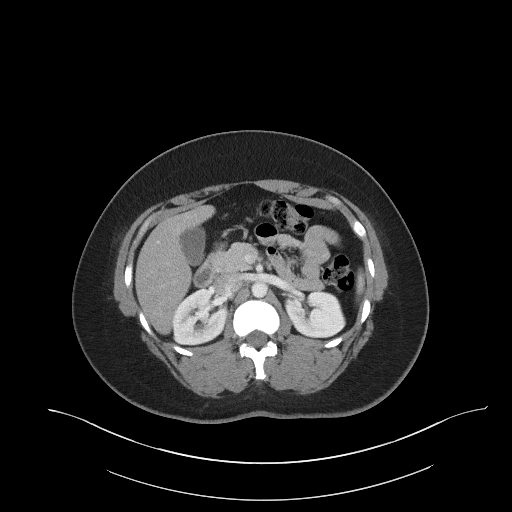
[im 79/100  soft-tissue]
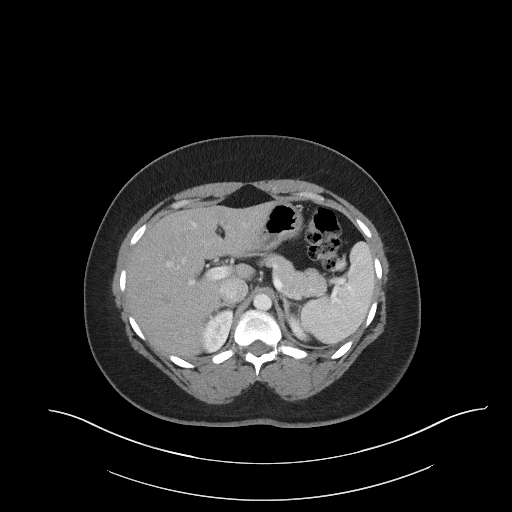
[im 87/100  soft-tissue]
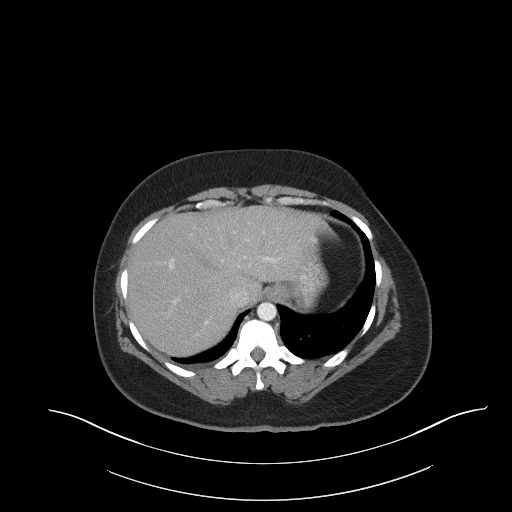
[im 95/100  soft-tissue]
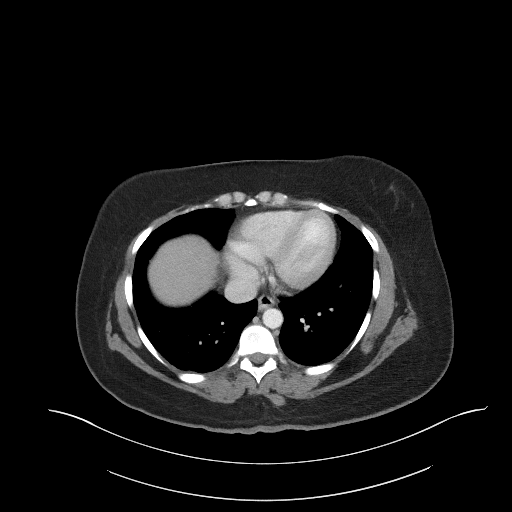

[Series 5: coronal st · coronal · 0.87mm/px · 3 of 96 slices shown]
[im 32/96  soft-tissue]
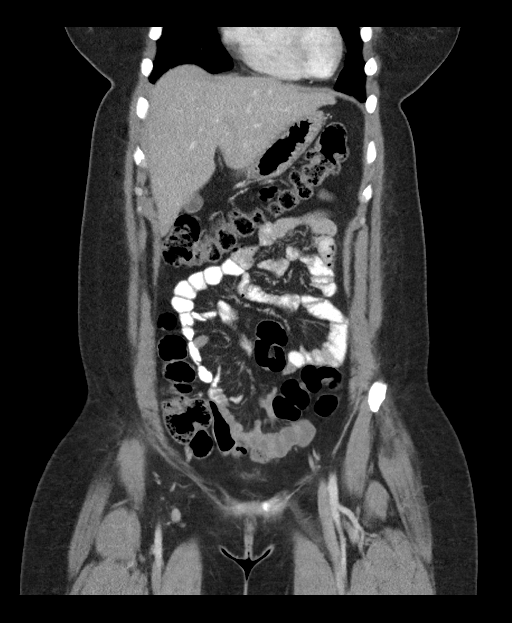
[im 43/96  soft-tissue]
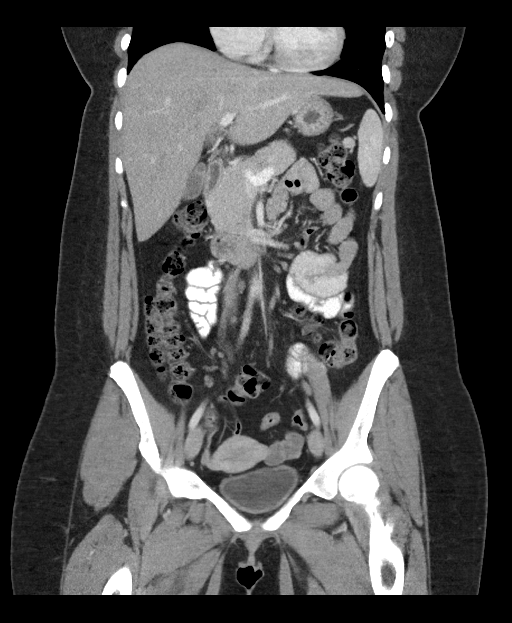
[im 53/96  soft-tissue]
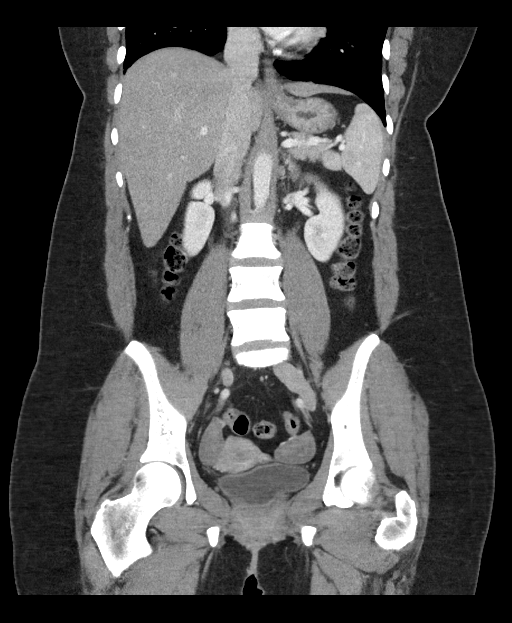

[16 of 46 positions shown; findings below may reference images not displayed]

FINDINGS: Lower chest: No acute abnormality.

Hepatobiliary: No focal liver abnormality. No gallstones,
gallbladder wall thickening, or pericholecystic fluid. No biliary
dilatation.

Pancreas: No focal lesion. Normal pancreatic contour. No surrounding
inflammatory changes. No main pancreatic ductal dilatation.

Spleen: Normal in size without focal abnormality.

Adrenals/Urinary Tract: No adrenal nodule bilaterally. Bilateral
kidneys enhance symmetrically. No hydronephrosis. No hydroureter.
The urinary bladder is unremarkable.

Stomach/Bowel: Stomach is within normal limits. No evidence of bowel
wall thickening or dilatation. Appendix appears normal.

Vascular/Lymphatic: No significant vascular findings are present. No
enlarged abdominal or pelvic lymph nodes.

Reproductive: Uterus, bilateral ovaries, bilateral adnexal regions
are unremarkable.

Other: No intraperitoneal free fluid. No intraperitoneal free gas.
No organized fluid collection.

Musculoskeletal:

No abdominal wall hernia or abnormality. Subcentimeter fluid density
lesion along the right flank subcutaneus soft tissues/dermis likely
represents a sebaceous cyst.

No suspicious lytic or blastic osseous lesions. No acute displaced
fracture. Multilevel degenerative changes of the spine.
IMPRESSION: Normal appendix with no acute intra-abdominal or intrapelvic
findings to explain etiology of patient's symptoms.

## 8386-11-26 DEATH — deceased
